# Patient Record
Sex: Male | Born: 1985 | Race: White | Hispanic: No | Marital: Single | Smoking: Never smoker
Health system: Southern US, Community
[De-identification: ages and names within clinical notes are randomized; demographics above are authoritative.]

## PROBLEM LIST (undated history)

## (undated) DIAGNOSIS — Z931 Gastrostomy status: Secondary | ICD-10-CM

## (undated) DIAGNOSIS — M419 Scoliosis, unspecified: Secondary | ICD-10-CM

## (undated) DIAGNOSIS — E1043 Type 1 diabetes mellitus with diabetic autonomic (poly)neuropathy: Secondary | ICD-10-CM

## (undated) DIAGNOSIS — E11649 Type 2 diabetes mellitus with hypoglycemia without coma: Secondary | ICD-10-CM

## (undated) DIAGNOSIS — G809 Cerebral palsy, unspecified: Secondary | ICD-10-CM

## (undated) DIAGNOSIS — G713 Mitochondrial myopathy, not elsewhere classified: Secondary | ICD-10-CM

## (undated) DIAGNOSIS — R5383 Other fatigue: Secondary | ICD-10-CM

## (undated) DIAGNOSIS — K219 Gastro-esophageal reflux disease without esophagitis: Secondary | ICD-10-CM

## (undated) DIAGNOSIS — F79 Unspecified intellectual disabilities: Secondary | ICD-10-CM

## (undated) DIAGNOSIS — M8580 Other specified disorders of bone density and structure, unspecified site: Secondary | ICD-10-CM

## (undated) HISTORY — PX: INGUINAL HERNIA REPAIR: SUR1180

## (undated) HISTORY — PX: NISSEN FUNDOPLICATION: SHX2091

## (undated) HISTORY — DX: Mitochondrial myopathy, not elsewhere classified: G71.3

## (undated) HISTORY — PX: GASTROSTOMY TUBE PLACEMENT: SHX655

## (undated) HISTORY — DX: Other fatigue: R53.83

## (undated) HISTORY — DX: Gastro-esophageal reflux disease without esophagitis: K21.9

## (undated) HISTORY — DX: Scoliosis, unspecified: M41.9

## (undated) HISTORY — DX: Type 1 diabetes mellitus with diabetic autonomic (poly)neuropathy: E10.43

## (undated) HISTORY — DX: Unspecified intellectual disabilities: F79

## (undated) HISTORY — PX: OTHER SURGICAL HISTORY: SHX169

## (undated) HISTORY — DX: Gastrostomy status: Z93.1

## (undated) HISTORY — DX: Type 2 diabetes mellitus with hypoglycemia without coma: E11.649

## (undated) HISTORY — DX: Other specified disorders of bone density and structure, unspecified site: M85.80

---
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
## 2006-07-07 ENCOUNTER — Ambulatory Visit: Payer: Self-pay | Admitting: "Endocrinology

## 2006-12-24 ENCOUNTER — Ambulatory Visit: Payer: Self-pay | Admitting: Internal Medicine

## 2007-03-23 ENCOUNTER — Ambulatory Visit: Payer: Self-pay | Admitting: "Endocrinology

## 2008-04-11 ENCOUNTER — Ambulatory Visit: Payer: Self-pay | Admitting: "Endocrinology

## 2009-01-06 ENCOUNTER — Telehealth (INDEPENDENT_AMBULATORY_CARE_PROVIDER_SITE_OTHER): Payer: Self-pay | Admitting: *Deleted

## 2009-02-21 ENCOUNTER — Ambulatory Visit: Payer: Self-pay | Admitting: "Endocrinology

## 2009-07-17 ENCOUNTER — Ambulatory Visit: Payer: Self-pay | Admitting: "Endocrinology

## 2009-12-25 ENCOUNTER — Ambulatory Visit: Payer: Self-pay | Admitting: "Endocrinology

## 2010-04-15 ENCOUNTER — Encounter: Payer: Self-pay | Admitting: Pediatrics

## 2010-04-30 ENCOUNTER — Ambulatory Visit: Payer: Self-pay | Admitting: "Endocrinology

## 2010-08-07 NOTE — Assessment & Plan Note (Signed)
[facility]                             PULMONARY OFFICE NOTE   NAME:RIVADENEYRADeveion, Thomas Conley                 MRN:          [id number]  DATE:12/24/2006                            DOB:          Mar 16, 1986    CHIEF COMPLAINT:  Cough.   HISTORY:  A 25 year old white male who carries a diagnosis of  myocardiomyopathy and scoliosis, but they're not really sure what's  wrong with him.  He is severely debilitated, wheelchair bound, G-tube  dependent for feeding, and has had recurrent episodes of pneumonia  complicated by lung abscess and bronchiectasis.  On his most recent  exacerbation of purulent tracheobronchitis, he was treated with a 5-day  course of Levaquin per G-tube, and is back to baseline now.   He apparently does better lying down in terms of both saturations and  mobilizing secretions.  The family has found that swimming is the best  exercise for him.  There has been no apparent significant increase in  dyspnea, decrease in sats, or increase in cough over baseline.  His  family appears extremely attentive regarding these issues.   The reason for his visit now is that he is approaching the age of 46,  and the family wanted him to have an adult pulmonologist.  He has been  under the care of Dr. Avel Sensor in pediatric pulmonology at [facility] since  he was a child.   PAST MEDICAL HISTORY:  Diabetes.   ALLERGIES:  SULFA, CEPHALOSPORINS, and AUGMENTIN caused GI problems.   MEDICATIONS:  1. Depakote.  2. Lantus.  3. NovoLog.  4. Albuterol.  5. Pulmicort.  6. Carafate.   SOCIAL HISTORY:  He, obviously, never smoked cigarettes.  He lives at  home and attends [employer].   FAMILY HISTORY:  Positive for asthma in grandparents only.   REVIEW OF SYSTEMS:  Taken in detail, negative except as outlined above.   PHYSICAL EXAMINATION:  This is a wheelchair-bound, chronically ill,  white male with obvious scoliotic deformity who does not make eye  contact  or follow any verbal commands including cough to request.  HEENT:  Unremarkable.  Oropharynx clear.  Dentition is intact.  NECK:  Supple without cervical adenopathy or tenderness.  CHEST:  Reveals severe kyphoscoliotic chest wall deformity.  Lung fields are diminished in the bases with few rhonchi, no definite  wheezing.  HEART:  Regular rate and rhythm without murmurs, gallops, or rubs.  ABDOMEN:  Soft, benign.  EXTREMITIES:  Warm without calf tenderness, cyanosis, clubbing, or  edema.  There was marked muscle wasting.   Hemoglobin saturation 92% on room air.   No recent chest x-rays available.   IMPRESSION:  This patient has a combination of restrictive lung disease  and an apparent myopathy as well as a central nervous system dysfunction  leading to very poor cough mechanics and a high risk of recurrent  pulmonary infections, as well as atelectasis and chronic respiratory  failure.  It is not clear to me that they have been fully informed as to  the likely outcome here (either accepting chronic ventilator or  premature death); however, I think they have  done a wonderful job taking  care of Thomas Conley, and do not have anything to add to his excellent care at  present.  I did applaud their efforts to get him more mobile, and  apparently swimming has been something that he enjoys doing, and has  been an effective mechanism to maintain mobility.   In terms of specifics, I recommended that the nebulizer treatments be  continued, and if increased wheezing occurs, simply increase the  Pulmicort to more frequent dosing (presently once a day is all they are  using, but they certainly could go up to 4 times a day if needed), and  to continue to use albuterol as well as the vest.  Although I am  available here in [place] to help out if needed, I strongly  recommended continued followup by the pediatric pulmonary service at  [facility], given the complexity of his care, and also would like Dr.   Avel Sensor to develop a contingency plan for this patient and his family in  terms of chronic ventilator, whether or not they would accept chronic  ventilator care, etc., rather than have this conversation with them  since they are brand new to me.  I did not bring this up.     Charlaine Dalton. Sherene Sires, MD, [facility]  Electronically Signed    MBW/MedQ  DD: 12/24/2006  DT: 12/25/2006  Job #: [id number]   cc:   Ardyth Harps, MD

## 2010-08-31 ENCOUNTER — Encounter: Payer: Self-pay | Admitting: Pediatrics

## 2010-08-31 DIAGNOSIS — R6252 Short stature (child): Secondary | ICD-10-CM

## 2010-08-31 DIAGNOSIS — E1065 Type 1 diabetes mellitus with hyperglycemia: Secondary | ICD-10-CM

## 2010-08-31 DIAGNOSIS — IMO0002 Reserved for concepts with insufficient information to code with codable children: Secondary | ICD-10-CM

## 2010-09-10 ENCOUNTER — Ambulatory Visit: Payer: Self-pay | Admitting: Pediatrics

## 2010-12-03 ENCOUNTER — Ambulatory Visit (INDEPENDENT_AMBULATORY_CARE_PROVIDER_SITE_OTHER): Payer: BC Managed Care – PPO | Admitting: "Endocrinology

## 2010-12-03 VITALS — BP 122/80 | HR 96

## 2010-12-03 DIAGNOSIS — E1169 Type 2 diabetes mellitus with other specified complication: Secondary | ICD-10-CM

## 2010-12-03 DIAGNOSIS — E049 Nontoxic goiter, unspecified: Secondary | ICD-10-CM

## 2010-12-03 DIAGNOSIS — E109 Type 1 diabetes mellitus without complications: Secondary | ICD-10-CM

## 2010-12-03 DIAGNOSIS — E1065 Type 1 diabetes mellitus with hyperglycemia: Secondary | ICD-10-CM

## 2010-12-03 DIAGNOSIS — IMO0002 Reserved for concepts with insufficient information to code with codable children: Secondary | ICD-10-CM

## 2010-12-03 DIAGNOSIS — E11649 Type 2 diabetes mellitus with hypoglycemia without coma: Secondary | ICD-10-CM

## 2010-12-03 LAB — POCT GLYCOSYLATED HEMOGLOBIN (HGB A1C): Hemoglobin A1C: 188

## 2010-12-03 LAB — GLUCOSE, POCT (MANUAL RESULT ENTRY): POC Glucose: 6.3

## 2010-12-03 NOTE — Patient Instructions (Signed)
Followup visit in 6 months. Please reduce the Lantus dose to 8 units at this time. Please have the thyroid labs and metabolic panel done at next blood draw for other studies.

## 2010-12-08 ENCOUNTER — Encounter: Payer: Self-pay | Admitting: "Endocrinology

## 2010-12-08 DIAGNOSIS — Z931 Gastrostomy status: Secondary | ICD-10-CM | POA: Insufficient documentation

## 2010-12-08 DIAGNOSIS — R5383 Other fatigue: Secondary | ICD-10-CM | POA: Insufficient documentation

## 2010-12-08 DIAGNOSIS — E11649 Type 2 diabetes mellitus with hypoglycemia without coma: Secondary | ICD-10-CM | POA: Insufficient documentation

## 2010-12-08 DIAGNOSIS — F79 Unspecified intellectual disabilities: Secondary | ICD-10-CM | POA: Insufficient documentation

## 2010-12-08 DIAGNOSIS — K219 Gastro-esophageal reflux disease without esophagitis: Secondary | ICD-10-CM | POA: Insufficient documentation

## 2010-12-08 DIAGNOSIS — A419 Sepsis, unspecified organism: Secondary | ICD-10-CM | POA: Insufficient documentation

## 2010-12-08 DIAGNOSIS — E1043 Type 1 diabetes mellitus with diabetic autonomic (poly)neuropathy: Secondary | ICD-10-CM | POA: Insufficient documentation

## 2010-12-08 DIAGNOSIS — G713 Mitochondrial myopathy, not elsewhere classified: Secondary | ICD-10-CM | POA: Insufficient documentation

## 2010-12-08 DIAGNOSIS — M8580 Other specified disorders of bone density and structure, unspecified site: Secondary | ICD-10-CM | POA: Insufficient documentation

## 2010-12-08 NOTE — Progress Notes (Addendum)
Subjective:  Patient Name: Thomas Conley Date of Birth: Dec 18, 1985  MRN: [id number]  Thomas Conley  presents to the office today for follow-up of his type 1 diabetes mellitus, hypoglycemia, goiter, autonomic neuropathy, tachycardia, mitochondrial myopathy, mental retardation, GERD, bronchiectasis, osteopenia, fatigue, gastrostomy tube dependence, and wheelchair dependence.  HISTORY OF PRESENT ILLNESS:   Thomas Conley is a 25 y.o. Caucasian young man. Thomas Conley was accompanied by his father.  1. The patient was first referred to me on 04/20/04 by his primary care pediatrician, Dr. Loyola Mast M.D., for evaluation and management of diabetes and other potentially  undiagnosed endocrine disease. Thomas Conley had just had his 19th birthday. He had a very interesting and challenging past medical history. I will address his past medical history in the following sub-paragraphs.  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 5 oz. His Apgar scores were high. There were no visible congenital anomalies. However, by 20 months of age he was failing to thrive in terms of height growth and weight growth and he was not meeting his developmental milestones. 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 The infant had GERD with projectile vomiting. At sometime in the past the child was able to walk with minimal assistance, but he later regressed neurologically and physically. By the time he came to see me he was completely wheelchair-dependent. It appears to me that in the last 6 years he has had a slow but progressive continued deterioration.  B. GERD, swallowing dysfunction, failure to thrive, and growth delay: At about 66 months of age he was noted to have the GERD with projectile vomiting and swallowing dysfunction. At about 46 months of age he had an episode of pneumonia which was thought possibly related to aspiration. Gastrostomy tube placement was  recommended at that time, but the parents refused. In 1988, however, it was clear that the reflux and swallowing dysfunction were continuing to be  problems and were interfering with his growth. A Nissen fundoplication and gastrostomy tube placement were done at that time. He was subsequently able to gain weight. He has remained on G-tube feedings ever since. He typically is fed every 2-1/2-3 hours.  C. Type 1 diabetes mellitus: About 2 weeks after the G-tube site was revised in approximately 1999, the child had an episode of peritonitis. At that time his blood glucose levels were noted to be elevated. The hemoglobin A1c was already elevated at that time. In 1999 or early 2000 he had an episode of hospitalization for diabetic ketoacidosis. He was initially treated with Novolin R insulin by sliding scale, with doses being given every one to 3 hours. About 3 years ago Lantus insulin was added. When I saw him for the first time in 2006, although his hemoglobin A1c was good at 6.9%, he was having a lot of very high and very low sugars. In looking at his blood glucose profiles and comparing them to his feeding profiles, it appeared that there was a major mismatch between the pharmacokinetics of his R. insulin and his feeding times. The time of peak and time of duration of the R insulin did not match the timing of the feedings that he was receiving. I changed his Novolin R to NovoLog aspart insulin, which matched the timing of his G-tube feedings much more closely. At that point his blood sugar values significantly improved. During the last 6 years his hemoglobin A1c values have ranged between 6.1-7.3%. Although in January  2009 his insulin C-peptide was within normal limits at 1.59 (normal 0.80-3.9), in January 2010 C-peptide had dropped to 0.82.  It appears that after his initial presentation with DKA he had a protracted honeymoon period. Over time, however, his ability to produce insulin has decreased. As a result his  Lantus doses have increased. His most recent Lantus dose is 9 units at bedtime. His dad manages his NovoLog aspart insulin doses very closely, giving him injections every 2-1/2 to 3 hours. At times the blood sugars have dropped too low, and I've asked dad to reduce insulin doses accordingly.  D. Neuropathy and Tachycardia: At times when I have examined this young man, his feet have been relatively insensitive to touch. He's also had a resting tachycardia on clinic visits. While he does use an albuterol nebulizer 2-4 times a day when he needs to, I have suspected for a long time that he has both mild peripheral neuropathy and perhaps a more moderate autonomic neuropathy with tachycardia. Trying to achieve reasonable blood sugar control is the appropriate treatment for both forms of neuropathy. 2. The patient's last [facility] visit was on 12/25/09. In the interim, the patient had an appendectomy in February. He was given narcotics for pain and stopped breathing. Resuscitation was required. He had to be on CPAP(?) for a period of hours. He was recently on a Z-pack for recurrent otitis media. He remains on his usual doses of Lantus insulin. He receives feedings by G-tube every 2.5-3  hours. Father adjusts his NovoLog doses 6-8 times a day based on the blood glucose results and how well the tube feedings are going. 3. Pertinent Review of Systems:  Constitutional: The patient remains wheelchair-dependent and G-tube-dependent. The curvature of the spine is getting worse and affects his breathing. Although he is not able to speak, he is able to sign a few simple words. He is able to interact with his parents and others in a limited way, with neither improvement nor deterioration since last visit. Dad thinks that he is able to move his arms a little bit better recently. He thinks Thomas Conley is also more interested in looking at doctors and other people that he visits with. Eyes: Vision is good with his current glasses. He had a  dilated diabetic eye examination 2 months ago.There were no reported signs of diabetic eye disease.  Neck: There are no apparent problems associated with of anterior neck swelling, soreness, tenderness,  pressure, discomfort, or difficulty swallowing.  Heart: Heart rate  remains consistently elevated. There are no apparent problems associated with palpitations, irregular heat beats, chest pain, or chest pressure. Gastrointestinal: There are no apparent problems associated with excessive hunger, acid reflux, upset stomach, stomach aches or pains, diarrhea, or constipation. He continues to receive a fiber solution through his G-tube. Legs: Muscle mass and strength  are much below normal. There are no apparent problems associated with numbness, tingling, burning, or pain. No edema is noted recently. Feet: There are no obvious foot problems. There are no apparent problems associated with of numbness, tingling, burning, or pain. No edema is noted Neurologic: There are no significant changes, except as noted above. 4. BG printout: Patient is having quite a few blood sugars in the 45-65 range. He had 3 of these in the past 2 weeks. His morning blood sugars are mostly in the 70s and 80s.   PAST MEDICAL, FAMILY, AND SOCIAL HISTORY:  Past Medical History  Diagnosis Date  . 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  Mitochondrial myopathy   . Mental retardation   . GERD (gastroesophageal reflux disease)   . Bronchiectasis   . Hypoglycemia associated with diabetes   . Osteopenia   . Fatigue   . Gastrostomy tube dependent     No family history on file.  Current outpatient prescriptions:divalproex (DEPAKOTE) 125 MG EC tablet, Take 125 mg by mouth 3 (three) times daily.  , Disp: , Rfl: ;  Ibuprofen (MOTRIN PO), Take by mouth.  , Disp: , Rfl: ;  Insulin Aspart (NOVOLOG FLEXPEN [place]), Inject into the skin.  , Disp: , Rfl: ;  insulin glargine (LANTUS) 100 UNIT/ML injection, Inject 9 Units  into the skin at bedtime. , Disp: , Rfl:  nystatin (MYCOSTATIN) powder, Apply topically 4 (four) times daily.  , Disp: , Rfl: ;  Tobramycin Sulfate (TOBREX OP), Apply to eye.  , Disp: , Rfl:   Allergies as of 12/03/2010 - Review Complete 12/03/2010  Allergen Reaction Noted  . Augmentin  08/31/2010  . Ceftin  08/31/2010  . Cephalosporins  08/31/2010  . Klonopin (clonazepam)  08/31/2010  . Morphine and related  12/03/2010  . Sulfa antibiotics  08/31/2010  . Tegretol (carbamazepine)  08/31/2010   1. Work and Family: Dad remains his major caretaker during the day. 2. Activities: The patient's ability to perform physical activities is very limited. 3. Smoking, alcohol, or drugs: None 4. Primary Care Provider: Dr. Loyola Mast   ROS: There are no other significant problems involving the patient's other body systems.   Objective:  Vital Signs:  BP 122/80  Pulse 96   Ht Readings from Last 3 Encounters:  No data found for Ht   PHYSICAL EXAM: Constitutional: The patient is sitting in his wheelchair, slumped toward the right side. He has a smiling, almost angelic expression on his face. He follows with his eyes, but has only limited ability to turn his head. His body is small and his muscles are quite atrophic.  Head: The head is normocephalic. Face: The face appears normal. There are no obvious dysmorphic features. Eyes: The eyes appear to be normally formed and spaced. Gaze is conjugate. There is no obvious arcus or proptosis. Moisture appears normal. Ears: The ears are normally placed and appear externally normal. Mouth: It is difficult to examine his mouth.  Neck: The neck appears to be visibly normal. No carotid bruits are noted. The thyroid gland is about 18-20 grams in size. The consistency of the thyroid gland is normal. The thyroid gland is not tender to palpation. He is tickled by this type of examination. Lungs: The lungs are clear to auscultation. Air movement is good. Heart:  Heart rate and rhythm are regular. Heart sounds S1 and S2 are normal. I did not appreciate any pathologic cardiac murmurs. Abdomen: The abdomen appears to be normal in size for the patient's size. Bowel sounds are normal. There is no obvious hepatomegaly, splenomegaly, or other mass effect.  Arms: Muscle size and bulk are much below normal for age. He has a little motion of his forearms and hands. Hands: There is no obvious tremor. Phalangeal and metacarpophalangeal joints are normal. Palmar muscles are atrophic for age. Palmar skin is normal. Palmar moisture is also normal. Legs: Muscles appear atrophic for age. No edema is present. Neurologic: Strength is far below normal for age in both the upper and lower extremities. Muscle tone is low. Sensation to touch is probably normal in both the legs and feet.    LAB DATA: Hemoglobin A1c was 6.3%  today.    Assessment and Plan:   ASSESSMENT:  1. Type 1 diabetes mellitus: Although his blood sugars as a whole have increased somewhat, he is also having to many low blood sugars. 2. Hypoglycemia: Although the number of low blood sugars is somewhat less frequent on this visit, he is still having many lows in the 40s to 60s. This increases the chance of having an epileptic seizure associated with hypoglycemia. 3. Goiter: Thyroid gland is normal size today. His TSH in October 2011 was at the lower limit of normal. 4. Mitochondrial myopathy: It is possible this problem maybe somewhat better in the past several months. 5. Autonomic neuropathy and tachycardia: These problems are essentially unchanged.   PLAN:  1. Diagnostic: Thyroid function tests and CMP at his next laboratory draw. 2. Therapeutic: Reduce the dose of Lantus insulin to 8 units at bedtime. 3. Patient education: We discussed the issue of hypoglycemia unawareness. In Thomas Conley, who has neither the cognitive abilities to recognize his low blood sugar nor the neurologic abilities to respond to  hypoglycemia, it is especially important to keep the blood sugars above 80. 4. Follow-up: Return in about 6 months (around 06/02/2011).  Level of Service: This visit lasted in excess of 40 minutes. More than 50% of the visit was devoted to counseling.  David Stall

## 2010-12-09 ENCOUNTER — Encounter: Payer: Self-pay | Admitting: "Endocrinology

## 2010-12-09 DIAGNOSIS — M419 Scoliosis, unspecified: Secondary | ICD-10-CM | POA: Insufficient documentation

## 2011-02-04 ENCOUNTER — Ambulatory Visit: Payer: BC Managed Care – PPO | Attending: Pediatrics | Admitting: Physical Therapy

## 2011-02-04 DIAGNOSIS — IMO0001 Reserved for inherently not codable concepts without codable children: Secondary | ICD-10-CM | POA: Insufficient documentation

## 2011-02-04 DIAGNOSIS — M24559 Contracture, unspecified hip: Secondary | ICD-10-CM | POA: Insufficient documentation

## 2011-02-26 ENCOUNTER — Other Ambulatory Visit: Payer: Self-pay | Admitting: "Endocrinology

## 2011-03-01 ENCOUNTER — Ambulatory Visit: Payer: BC Managed Care – PPO | Attending: Pediatrics | Admitting: Physical Therapy

## 2011-03-01 DIAGNOSIS — M24559 Contracture, unspecified hip: Secondary | ICD-10-CM | POA: Insufficient documentation

## 2011-03-01 DIAGNOSIS — IMO0001 Reserved for inherently not codable concepts without codable children: Secondary | ICD-10-CM | POA: Insufficient documentation

## 2011-04-05 ENCOUNTER — Ambulatory Visit: Payer: BC Managed Care – PPO | Attending: Pediatrics | Admitting: Physical Therapy

## 2011-04-05 DIAGNOSIS — IMO0001 Reserved for inherently not codable concepts without codable children: Secondary | ICD-10-CM | POA: Insufficient documentation

## 2011-04-05 DIAGNOSIS — M24559 Contracture, unspecified hip: Secondary | ICD-10-CM | POA: Insufficient documentation

## 2011-06-03 ENCOUNTER — Encounter: Payer: Self-pay | Admitting: "Endocrinology

## 2011-06-03 ENCOUNTER — Ambulatory Visit (INDEPENDENT_AMBULATORY_CARE_PROVIDER_SITE_OTHER): Payer: Medicaid Other | Admitting: "Endocrinology

## 2011-06-03 VITALS — BP 107/67 | HR 91

## 2011-06-03 DIAGNOSIS — G7289 Other specified myopathies: Secondary | ICD-10-CM

## 2011-06-03 DIAGNOSIS — E1143 Type 2 diabetes mellitus with diabetic autonomic (poly)neuropathy: Secondary | ICD-10-CM

## 2011-06-03 DIAGNOSIS — E1065 Type 1 diabetes mellitus with hyperglycemia: Secondary | ICD-10-CM

## 2011-06-03 DIAGNOSIS — R000 Tachycardia, unspecified: Secondary | ICD-10-CM

## 2011-06-03 DIAGNOSIS — E1169 Type 2 diabetes mellitus with other specified complication: Secondary | ICD-10-CM

## 2011-06-03 DIAGNOSIS — IMO0002 Reserved for concepts with insufficient information to code with codable children: Secondary | ICD-10-CM

## 2011-06-03 DIAGNOSIS — E1142 Type 2 diabetes mellitus with diabetic polyneuropathy: Secondary | ICD-10-CM

## 2011-06-03 DIAGNOSIS — G909 Disorder of the autonomic nervous system, unspecified: Secondary | ICD-10-CM

## 2011-06-03 DIAGNOSIS — E1149 Type 2 diabetes mellitus with other diabetic neurological complication: Secondary | ICD-10-CM

## 2011-06-03 DIAGNOSIS — E11649 Type 2 diabetes mellitus with hypoglycemia without coma: Secondary | ICD-10-CM

## 2011-06-03 DIAGNOSIS — G713 Mitochondrial myopathy, not elsewhere classified: Secondary | ICD-10-CM

## 2011-06-03 DIAGNOSIS — E049 Nontoxic goiter, unspecified: Secondary | ICD-10-CM

## 2011-06-03 LAB — POCT GLYCOSYLATED HEMOGLOBIN (HGB A1C): Hemoglobin A1C: 5.3

## 2011-06-03 LAB — GLUCOSE, POCT (MANUAL RESULT ENTRY): POC Glucose: 221

## 2011-06-03 NOTE — Progress Notes (Addendum)
Subjective:  Patient Name: Thomas Conley Date of Birth: April 07, 1985  MRN: [id number]  Thomas Conley  presents to the office today for follow-up of his type 1 diabetes mellitus, hypoglycemia, goiter, autonomic neuropathy, tachycardia, mitochondrial myopathy, mental retardation, GERD, bronchiectasis,  osteopenia, fatigue, gastrostomy tube dependence, and wheelchair dependence.  HISTORY OF PRESENT ILLNESS:   Thomas Conley is a 26 y.o. Caucasian young man. Misty was accompanied by his father.  1. The patient was first referred to me on 04/20/04 by his primary care pediatrician, Dr. Loyola Mast M.D., for evaluation and management of diabetes and other potentially  undiagnosed endocrine disease. Thomas Conley had just had his 19th birthday. He had a very interesting and challenging past medical history. I will address his past medical history in the following sub-paragraphs.  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 5 oz. His Apgar scores were high. There were no visible congenital anomalies. However, by 31 months of age he was failing to thrive in terms of height growth and weight growth and he was not meeting his developmental milestones. 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 The infant had GERD with projectile vomiting. At sometime in the past the child was able to walk with minimal assistance, but he later regressed neurologically and physically. By the time he came to see me he was completely wheelchair-dependent. It appears to me that in the last 6 years he has had a slow but progressive continued deterioration.  B. GERD, swallowing dysfunction, failure to thrive, and growth delay: At about 10 months of age he was noted to have the GERD with projectile vomiting and swallowing dysfunction. At about 82 months of age he had an episode of pneumonia which was thought possibly related to aspiration. Gastrostomy tube placement was  recommended at that time, but the parents refused. In 1988, however, it was clear that the reflux and swallowing dysfunction were continuing to be  problems and were interfering with his growth. A Nissen fundoplication and gastrostomy tube placement were done at that time. He was subsequently able to gain weight. He has remained on G-tube feedings ever since. He typically is fed every 2-1/2-3 hours.  C. Type 1 diabetes mellitus: About 2 weeks after the G-tube site was revised in approximately 1999, the child had an episode of peritonitis. At that time his blood glucose levels were noted to be elevated. The hemoglobin A1c was already elevated at that time. In 1999 or early 2000 he had an episode of hospitalization for diabetic ketoacidosis. He was initially treated with Novolin R insulin by sliding scale, with doses being given every one to 3 hours. About 3 years ago Lantus insulin was added. When I saw him for the first time in 2006, although his hemoglobin A1c was good at 6.9%, he was having a lot of very high and very low sugars. In looking at his blood glucose profiles and comparing them to his feeding profiles, it appeared that there was a major mismatch between the pharmacokinetics of his R. insulin and his feeding times. The time of peak and time of duration of the R insulin did not match the timing of the feedings that he was receiving. I changed his Novolin R to NovoLog aspart insulin, which matched the timing of his G-tube feedings much more closely. At that point his blood sugar values significantly improved. During the last 6 years his hemoglobin A1c values have ranged between 6.1-7.3%. Although in  January 2009 his insulin C-peptide was within normal limits at 1.59 (normal 0.80-3.9), in January 2010 C-peptide had dropped to 0.82.  It appears that after his initial presentation with DKA he had a protracted honeymoon period. Over time, however, his ability to produce insulin has decreased. As a result his  Lantus doses have increased. His most recent Lantus dose is 9 units at bedtime. His dad manages his NovoLog aspart insulin doses very closely, giving him injections every 2-1/2 to 3 hours. At times the blood sugars have dropped too low, and I've asked dad to reduce insulin doses accordingly.  D. Peripheral neuropathy, autonomic neuropathy, and tachycardia: At times when I examined this young man, his feet have been relatively insensitive to touch. He's also had a resting tachycardia on clinic visits. While he does use an albuterol nebulizer 2-4 times a day when he needs to, I have suspected for a long time that he has both mild peripheral neuropathy and perhaps a more moderate autonomic neuropathy with tachycardia. Trying to achieve reasonable blood sugar control is the appropriate treatment for both forms of neuropathy. 2. The patient's last [facility] visit was on 12/03/10. In the interim, the patient has been pretty healthy, except for a URI about a month ago and a couple of episodes of otitis media. He is still sensitive to amoxicillin, but is resistant to many other antibiotics.  He remains on his usual doses of Lantus insulin, which is now 9 units. Dad had forgotten that I asked him to reduce the Lantus dose to 8 units at last visit. When Thomas Conley gets sick and has increased BGs, dad increases his Lantus dose by 1-2 units. Thomas Conley receives feedings by G-tube every 2.5-3  hours. Father adjusts his NovoLog doses 6-8 times a day based on the blood glucose results and how well the tube feedings are going. 3. Pertinent Review of Systems:  Constitutional: The patient remains wheelchair-dependent and G-tube-dependent. The curvature of the spine is about status quo. Family is not interested in trying surgery. The parents are afraid that he would develop an overwhelming infection and die of sepsis. Although Thomas Conley is not able to speak, he is able to sign a few simple words. He is able to interact with his parents and others in  a limited way, with neither improvement nor deterioration since last visit. Some days go better than others. Thomas Conley's allergies are beginning to act up. Eyes: Vision is good with his current glasses. He had a dilated diabetic eye examination 8 months ago.There were no reported signs of diabetic eye disease.  Neck: There are no apparent problems associated with of anterior neck swelling, soreness, tenderness,  pressure, discomfort, or difficulty swallowing.  Heart: Heart rate  remains consistently elevated. There are no apparent problems associated with palpitations, irregular heat beats, chest pain, or chest pressure. Gastrointestinal: There are no apparent problems associated with excessive hunger, acid reflux, upset stomach, stomach aches or pains, diarrhea, or constipation. He continues to receive a fiber solution through his G-tube. Legs: Muscle mass and strength  are much below normal. There are no apparent problems associated with numbness, tingling, burning, or pain. No edema is noted recently. Feet: There are no obvious foot problems. There are no apparent problems associated with of numbness, tingling, burning, or pain. No edema is noted Neurologic: There are no significant changes, except as noted above. Hypoglycemia: Most episodes occur at lunch on weekends.  4. BG printout: Patient is having occasional BGs in the 50s-60s, usually at lunch on  weekends, especially if he eats lunch late. Dad says that Thomas Conley is more physically active on weekends. Thomas Conley's average BG is about 107.   PAST MEDICAL, FAMILY, AND SOCIAL HISTORY:  Past Medical History  Diagnosis Date  . 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 Mitochondrial myopathy   . Mental retardation   . GERD (gastroesophageal reflux disease)   . Bronchiectasis   . Hypoglycemia associated with diabetes   . Osteopenia   . Fatigue   . Gastrostomy tube dependent   . Scoliosis     Family History  Problem Relation  Age of Onset  . Cancer Neg Hx   . Diabetes Neg Hx   . Thyroid disease Neg Hx     Current outpatient prescriptions:divalproex (DEPAKOTE) 125 MG EC tablet, Take 125 mg by mouth 3 (three) times daily.  , Disp: , Rfl: ;  Ibuprofen (MOTRIN PO), Take by mouth.  , Disp: , Rfl: ;  insulin glargine (LANTUS) 100 UNIT/ML injection, Inject 9 Units into the skin at bedtime. , Disp: , Rfl: ;  NOVOLOG FLEXPEN 100 UNIT/ML injection, USE AS DIRECTED WITH FEEDING, Disp: 15 Syringe, Rfl: 5 nystatin (MYCOSTATIN) powder, Apply topically 4 (four) times daily.  , Disp: , Rfl: ;  Tobramycin Sulfate (TOBREX OP), Apply to eye.  , Disp: , Rfl:   Allergies as of 06/03/2011 - Review Complete 06/03/2011  Allergen Reaction Noted  . Augmentin  08/31/2010  . Cefuroxime axetil  08/31/2010  . Cephalosporins  08/31/2010  . Klonopin (clonazepam)  08/31/2010  . Morphine and related  12/03/2010  . Sulfa antibiotics  08/31/2010  . Tegretol (carbamazepine)  08/31/2010   1. Work and Family: Dad remains his major caretaker during the day. 2. Activities: The patient's ability to perform physical activities is very limited. 3. Smoking, alcohol, or drugs: None 4. Primary Care Provider: Dr. Loyola Mast   ROS: There are no other significant problems involving the patient's other body systems.   Objective:  Vital Signs:  BP 107/67  Pulse 91   Ht Readings from Last 3 Encounters:  No data found for Ht   PHYSICAL EXAM: Constitutional: The patient is sitting in his wheelchair, slumped toward the left side. He has a smiling, almost angelic expression on his face. He was not as engaged earlier in the visit, but once I began the physical exam he was more engaged. He even laughed a lot. He follows with his eyes, but has only limited ability to turn his head. His body is small and his muscles are quite atrophic.  Head: The head is normocephalic. Face: The face appears normal. There are no obvious dysmorphic features. Eyes: The eyes  appear to be normally formed and spaced. Gaze is conjugate. There is no obvious arcus or proptosis. Moisture appears normal. Ears: The ears are normally placed and appear externally normal. Mouth: It is difficult to examine his mouth. He had a little gingivitis around one of his right maxillary pre-molar teeth. Neck: The neck appears to be visibly normal. No carotid bruits are noted. The thyroid exam is difficult today, since his neck is bent laterally to the left. The gland is about 18-20 grams in size. The consistency of the thyroid gland is normal. The thyroid gland is not tender to palpation. He is tickled by this type of examination. Lungs: The lungs are clear to auscultation. Air movement is good. Heart: Heart rate and rhythm are regular. Heart sounds S1 and S2 are normal. I did not  appreciate any pathologic cardiac murmurs. Abdomen: The abdomen appears to be normal in size for the patient's size. Bowel sounds are normal. There is no obvious hepatomegaly, splenomegaly, or other mass effect.  Arms: Muscle size and bulk are much below normal for age. He has a little motion of his forearms and hands. Hands: There is no obvious tremor. Phalangeal and metacarpophalangeal joints are normal. Palmar muscles are atrophic for age. Palmar skin is normal. Palmar moisture is also normal. Legs: Muscles appear atrophic for age. No edema is present. Neurologic: Strength is far below normal for age in both the upper and lower extremities. Muscle tone is low. Sensation to touch is probably normal in both the legs and feet.    LAB DATA: Hemoglobin A1c was 5.3% today, versus 6.3% in September.    Assessment and Plan:   ASSESSMENT:  1. Type 1 diabetes mellitus: His blood sugars as a whole have decreased somewhat. He is still having to many low blood sugars.  2. Hypoglycemia: He is having too many low BGs. Given his inability to recognize and respond to hypoglycemia, we need to be less tight with his insulin  regimen.  We want to minimize the risks of him having so low a BG that he has an epileptic seizure. 3. Goiter: Thyroid gland is normal size today. His TSH in October 2011 was at the lower limit of normal. Dad forgot to have his CMP and thyroid labs done when Dr. Rana Snare ordered a CBC recently. 4. Mitochondrial myopathy: This problem is probably essentially unchanged.  5. Autonomic neuropathy and tachycardia: The heart rate is lower today, paralleling the lower BGs.    PLAN:  1. Diagnostic: Thyroid function tests and CMP at his next laboratory draw. 2. Therapeutic: Reduce the dose of Lantus insulin to 8 units at bedtime. 3. Patient education: We discussed the issue of hypoglycemia unawareness. In Thomas Conley, who has neither the cognitive abilities to recognize his low blood sugar nor the neurologic abilities to respond to hypoglycemia, it is especially important to keep the blood sugars above 80. 4. Follow-up: 6 months. Dad will call if he has increased difficulties in controlling Thomas Conley's BGs   Level of Service: This visit lasted in excess of 40 minutes. More than 50% of the visit was devoted to counseling.  David Stall

## 2011-06-03 NOTE — Patient Instructions (Signed)
Followup visit in 6 months. Please reduce the Lantus dose to 8 units at bedtime. Please reduce the breakfast NovoLog by 1-2 units if planning to be out and about on weekends or if lunch is anticipated to be later than usual.

## 2011-11-18 ENCOUNTER — Other Ambulatory Visit: Payer: Self-pay | Admitting: Pediatrics

## 2011-11-18 ENCOUNTER — Ambulatory Visit
Admission: RE | Admit: 2011-11-18 | Discharge: 2011-11-18 | Disposition: A | Discharge status: Home / Self Care | Payer: BC Managed Care – PPO | Source: Ambulatory Visit | Attending: Pediatrics | Admitting: Pediatrics

## 2011-11-18 DIAGNOSIS — R05 Cough: Secondary | ICD-10-CM

## 2011-11-29 ENCOUNTER — Other Ambulatory Visit (HOSPITAL_COMMUNITY): Payer: Self-pay | Admitting: Pediatrics

## 2011-11-29 DIAGNOSIS — Z931 Gastrostomy status: Secondary | ICD-10-CM

## 2011-12-05 ENCOUNTER — Other Ambulatory Visit (HOSPITAL_COMMUNITY): Payer: BC Managed Care – PPO

## 2011-12-20 ENCOUNTER — Other Ambulatory Visit: Payer: Self-pay | Admitting: Pediatrics

## 2011-12-20 ENCOUNTER — Ambulatory Visit
Admission: RE | Admit: 2011-12-20 | Discharge: 2011-12-20 | Disposition: A | Discharge status: Home / Self Care | Payer: BC Managed Care – PPO | Source: Ambulatory Visit | Attending: Pediatrics | Admitting: Pediatrics

## 2011-12-20 ENCOUNTER — Other Ambulatory Visit: Payer: Self-pay | Admitting: "Endocrinology

## 2011-12-20 DIAGNOSIS — R05 Cough: Secondary | ICD-10-CM

## 2011-12-20 DIAGNOSIS — R062 Wheezing: Secondary | ICD-10-CM

## 2011-12-23 ENCOUNTER — Ambulatory Visit: Payer: BC Managed Care – PPO | Attending: Pediatrics | Admitting: Physical Therapy

## 2011-12-23 DIAGNOSIS — R293 Abnormal posture: Secondary | ICD-10-CM | POA: Insufficient documentation

## 2011-12-23 DIAGNOSIS — M24559 Contracture, unspecified hip: Secondary | ICD-10-CM | POA: Insufficient documentation

## 2011-12-23 DIAGNOSIS — IMO0001 Reserved for inherently not codable concepts without codable children: Secondary | ICD-10-CM | POA: Insufficient documentation

## 2011-12-30 ENCOUNTER — Ambulatory Visit: Payer: BC Managed Care – PPO | Admitting: "Endocrinology

## 2012-07-03 ENCOUNTER — Other Ambulatory Visit: Payer: Self-pay | Admitting: "Endocrinology

## 2012-08-25 ENCOUNTER — Other Ambulatory Visit: Payer: Self-pay | Admitting: "Endocrinology

## 2013-02-01 ENCOUNTER — Emergency Department (HOSPITAL_COMMUNITY)
Admission: EM | Admit: 2013-02-01 | Discharge: 2013-02-01 | Disposition: A | Discharge status: Home / Self Care | Payer: Managed Care, Other (non HMO) | Attending: Emergency Medicine | Admitting: Emergency Medicine

## 2013-02-01 ENCOUNTER — Encounter (HOSPITAL_COMMUNITY): Payer: Self-pay | Admitting: Emergency Medicine

## 2013-02-01 ENCOUNTER — Emergency Department (HOSPITAL_COMMUNITY): Payer: Managed Care, Other (non HMO)

## 2013-02-01 DIAGNOSIS — Z794 Long term (current) use of insulin: Secondary | ICD-10-CM | POA: Insufficient documentation

## 2013-02-01 DIAGNOSIS — F79 Unspecified intellectual disabilities: Secondary | ICD-10-CM | POA: Insufficient documentation

## 2013-02-01 DIAGNOSIS — W050XXA Fall from non-moving wheelchair, initial encounter: Secondary | ICD-10-CM | POA: Insufficient documentation

## 2013-02-01 DIAGNOSIS — S82009A Unspecified fracture of unspecified patella, initial encounter for closed fracture: Secondary | ICD-10-CM | POA: Insufficient documentation

## 2013-02-01 DIAGNOSIS — Z882 Allergy status to sulfonamides status: Secondary | ICD-10-CM | POA: Insufficient documentation

## 2013-02-01 DIAGNOSIS — Y939 Activity, unspecified: Secondary | ICD-10-CM | POA: Insufficient documentation

## 2013-02-01 DIAGNOSIS — S0003XA Contusion of scalp, initial encounter: Secondary | ICD-10-CM | POA: Insufficient documentation

## 2013-02-01 DIAGNOSIS — E109 Type 1 diabetes mellitus without complications: Secondary | ICD-10-CM | POA: Insufficient documentation

## 2013-02-01 DIAGNOSIS — Z8709 Personal history of other diseases of the respiratory system: Secondary | ICD-10-CM | POA: Insufficient documentation

## 2013-02-01 DIAGNOSIS — S0001XA Abrasion of scalp, initial encounter: Secondary | ICD-10-CM

## 2013-02-01 DIAGNOSIS — S0990XA Unspecified injury of head, initial encounter: Secondary | ICD-10-CM

## 2013-02-01 DIAGNOSIS — M4120 Other idiopathic scoliosis, site unspecified: Secondary | ICD-10-CM | POA: Insufficient documentation

## 2013-02-01 DIAGNOSIS — Z79899 Other long term (current) drug therapy: Secondary | ICD-10-CM | POA: Insufficient documentation

## 2013-02-01 DIAGNOSIS — Z931 Gastrostomy status: Secondary | ICD-10-CM | POA: Insufficient documentation

## 2013-02-01 DIAGNOSIS — S81009A Unspecified open wound, unspecified knee, initial encounter: Secondary | ICD-10-CM | POA: Insufficient documentation

## 2013-02-01 DIAGNOSIS — R269 Unspecified abnormalities of gait and mobility: Secondary | ICD-10-CM | POA: Insufficient documentation

## 2013-02-01 DIAGNOSIS — Y929 Unspecified place or not applicable: Secondary | ICD-10-CM | POA: Insufficient documentation

## 2013-02-01 DIAGNOSIS — M899 Disorder of bone, unspecified: Secondary | ICD-10-CM | POA: Insufficient documentation

## 2013-02-01 DIAGNOSIS — G7289 Other specified myopathies: Secondary | ICD-10-CM | POA: Insufficient documentation

## 2013-02-01 DIAGNOSIS — S82002A Unspecified fracture of left patella, initial encounter for closed fracture: Secondary | ICD-10-CM

## 2013-02-01 DIAGNOSIS — Z993 Dependence on wheelchair: Secondary | ICD-10-CM | POA: Insufficient documentation

## 2013-02-01 DIAGNOSIS — Z881 Allergy status to other antibiotic agents status: Secondary | ICD-10-CM | POA: Insufficient documentation

## 2013-02-01 DIAGNOSIS — M6281 Muscle weakness (generalized): Secondary | ICD-10-CM | POA: Insufficient documentation

## 2013-02-01 DIAGNOSIS — K219 Gastro-esophageal reflux disease without esophagitis: Secondary | ICD-10-CM | POA: Insufficient documentation

## 2013-02-01 DIAGNOSIS — Z888 Allergy status to other drugs, medicaments and biological substances status: Secondary | ICD-10-CM | POA: Insufficient documentation

## 2013-02-01 DIAGNOSIS — IMO0002 Reserved for concepts with insufficient information to code with codable children: Secondary | ICD-10-CM | POA: Insufficient documentation

## 2013-02-01 DIAGNOSIS — Z885 Allergy status to narcotic agent status: Secondary | ICD-10-CM | POA: Insufficient documentation

## 2013-02-01 MED ORDER — IBUPROFEN 100 MG/5ML PO SUSP
400.0000 mg | Freq: Once | ORAL | Status: AC
  Administered 2013-02-01: 400 mg
  Filled 2013-02-01: qty 20

## 2013-02-01 NOTE — ED Notes (Signed)
Father feeding and giving daily meds thru PEG tube, child alert, NAD, calm, no dyspnea, tracking and interactive per norm with some mild grimacing per parents. VSS.

## 2013-02-01 NOTE — ED Notes (Addendum)
Per ems pt was in his wheelchair when he fell face forward on the concrete, small hematoma to right forehead. EMS reports no LOC, per dad pt is alert and oriented. EMS reports vital signs stable, CBG 57 en route. Pt dad gave pt something to eat en route. 120 hr bp 148/103 65% (pt dad states this is normal. )

## 2013-02-01 NOTE — ED Notes (Addendum)
Pt updated, EDP into room prior to d/c, parents x2 present.

## 2013-02-01 NOTE — ED Notes (Signed)
Back from xray/ CT, parents and family at [facility], NAD, calm, no changes.

## 2013-02-01 NOTE — ED Provider Notes (Signed)
CSN: [id number]     Arrival date & time 02/01/13  1750 History   First MD Initiated Contact with Patient 02/01/13 1759     Chief Complaint  Patient presents with  . Fall   (Consider location/radiation/quality/duration/timing/severity/associated sxs/prior Treatment) HPI Comments: 26 yo male with DM, mitochondrial myopathy, bronchiectasis, G tube feeding, wheel chair use presents with facial and knee injuries since PTA when he fell forward out of his wheel chair, he was not strapped in.  No loc or vomiting. Pt at baseline.  Shows pain with palpation of injuries per father.   Patient is a 27 y.o. male presenting with fall. The history is provided by a parent.  Fall This is a new problem.    Past Medical History  Diagnosis Date  . 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 Mitochondrial myopathy   . Mental retardation   . GERD (gastroesophageal reflux disease)   . Bronchiectasis   . Hypoglycemia associated with diabetes   . Osteopenia   . Fatigue   . Gastrostomy tube dependent   . Scoliosis    Past Surgical History  Procedure Laterality Date  . Gastrostomy tube placement    . Muscle biopsies    . Nissen fundoplication    . Pressure equalization tube    . Inguinal hernia repair    . Left tear duct     Family History  Problem Relation Age of Onset  . Cancer Neg Hx   . Diabetes Neg Hx   . Thyroid disease Neg Hx    History  Substance Use Topics  . Smoking status: Never Smoker   . Smokeless tobacco: Not on file  . Alcohol Use: No    Review of Systems  Unable to perform ROS: Patient nonverbal  Constitutional: Negative for fever.  Gastrointestinal: Negative for vomiting.  Musculoskeletal: Positive for gait problem.  Skin: Positive for wound.    Allergies  Augmentin; Cefuroxime axetil; Cephalosporins; Klonopin; Morphine and related; Sulfa antibiotics; and Tegretol  Home Medications   Current Outpatient Rx  Name  Route  Sig  Dispense  Refill   . albuterol (PROVENTIL) (5 MG/ML) 0.5% nebulizer solution   Nebulization   Take 2.5 mg by nebulization every 4 (four) hours as needed for wheezing or shortness of breath.         . budesonide (PULMICORT) 0.5 MG/2ML nebulizer solution   Nebulization   Take 0.5 mg by nebulization 2 (two) times daily.         . divalproex (DEPAKOTE SPRINKLE) 125 MG capsule   Per Tube   Place 250-325 mg into feeding tube 2 (two) times daily before lunch and supper. 2 tabs (250 mg) with lunch, and 3 tabs (325 mg) with dinner. Opens capsules and gives via G-tube         . divalproex (DEPAKOTE SPRINKLE) 125 MG capsule   Per Tube   Place 250-325 mg into feeding tube daily with breakfast. Alternates between 2 tabs (250 mg) and 3 tabs (325 mg) daily. Opens capsules and gives via G-tube         . erythromycin ophthalmic ointment   Left Eye   Place 1 application into the left eye as needed (Eye infection).         Marland Kitchen ibuprofen (ADVIL,MOTRIN) 100 MG/5ML suspension   Per Tube   Place 400 mg into feeding tube every 4 (four) hours as needed for fever or mild pain.          Marland Kitchen  insulin aspart (NOVOLOG) 100 UNIT/ML injection   Subcutaneous   Inject into the skin 3 (three) times daily before meals. Via home sliding scale insulin         . insulin glargine (LANTUS) 100 UNIT/ML injection   Subcutaneous   Inject 9.5 Units into the skin daily.         Marland Kitchen nystatin (MYCOSTATIN) powder   Topical   Apply topically as needed. Use around his G-tube and genitals when needed         . ofloxacin (FLOXIN) 0.3 % otic solution   Both Ears   Place 5 drops into both ears daily.         Marland Kitchen tobramycin (TOBREX) 0.3 % ophthalmic ointment   Left Eye   Place 1 application into the left eye as needed.         Marland Kitchen acetaminophen (TYLENOL) 160 MG/5ML solution   Oral   Take 640 mg by mouth every 6 (six) hours as needed.          BP 123/86  Pulse 111  Temp(Src) 97.5 F (36.4 C) (Axillary)  Resp 18  SpO2  92% Physical Exam  Nursing note and vitals reviewed. Constitutional: He appears well-developed and well-nourished.  HENT:  Head: Normocephalic.  Abrasion and contusion with mild swelling to right parietal region, no step off No epistaxis Neck supple, no signs of pain to palpation of vertebrae Full rom of neck without discomfort  Eyes: Conjunctivae are normal. Right eye exhibits no discharge. Left eye exhibits no discharge.  Neck: Normal range of motion. Neck supple. No tracheal deviation present.  Cardiovascular: Normal rate and regular rhythm.   Pulmonary/Chest: Effort normal and breath sounds normal.  Abdominal: Soft. He exhibits no distension. There is no tenderness. There is no guarding.  Musculoskeletal: He exhibits edema and tenderness.  Neurological: He is alert.  Non verbal, alert to voice, perrl Legs flexed, general weakness  Skin: Skin is warm. No rash noted.  Psychiatric: He has a normal mood and affect.    ED Course  Procedures (including critical care time) Labs Review Labs Reviewed - No data to display Imaging Review Dg Knee 1-2 Views Left  02/01/2013   CLINICAL DATA:  Bilateral knee lacerations and anterior bruising following a fall. Unable to straighten the knees.  EXAM: LEFT KNEE - 1-2 VIEW  COMPARISON:  Right knee radiographs obtained at the same time. These include the AP view of the left knee.  FINDINGS: Suboptimal visualization of the left knee in the frontal projection due to the inability to straighten the knee. There is diffuse osteopenia. There is a small amount of tendon ossification at the inferior aspect of the patella. There is also an obliquely oriented linear lucency crossing a portion of the inferior patella on the lateral view, not seen on the frontal view.  IMPRESSION: Possible nondisplaced fracture of the inferior left patella.   Electronically Signed   By: Gordan Payment M.D.   On: 02/01/2013 20:02   Dg Knee 1-2 Views Right  02/01/2013   CLINICAL  DATA:  Patient fell from wheelchair  EXAM: RIGHT KNEE - 1-2 VIEW  COMPARISON:  None.  FINDINGS: Diffuse osteopenia noted. No evidence of fracture dislocation of the left right knee. Cross-table lateral view of the right knee demonstrates no joint effusion.  IMPRESSION: No fracture or effusion. Osteopenia.   Electronically Signed   By: Genevive Bi M.D.   On: 02/01/2013 20:09   Ct Head Wo Contrast  02/01/2013  CLINICAL DATA:  Fall with right-sided hematoma.  EXAM: CT HEAD WITHOUT CONTRAST  TECHNIQUE: Contiguous axial images were obtained from the base of the skull through the vertex without intravenous contrast.  COMPARISON:  None.  FINDINGS: Positioning is atypical. There is a right frontal scalp hematoma. No underlying skull fracture. The calvarium is markedly thickened. No evidence of intracranial hemorrhage. No brain lesion is seen. No fluid in the sinuses.  IMPRESSION: Right frontal scalp swelling. No underlying skull fracture. Chronically PICC and calvarium. No intracranial injury evident.   Electronically Signed   By: Paulina Fusi M.D.   On: 02/01/2013 20:15    EKG Interpretation   None       MDM   1. Head injury, initial encounter   2. Scalp abrasion, initial encounter   3. Left patella fracture, closed, initial encounter    Mechanical fall. Pt at baseline per father.  No blood thinners.  Plan for CT head and xrays knees. Motrin G tube given, father says that is what he takes at home.   Head injury, Facial contusion    Enid Skeens, MD 02/01/13 2030

## 2013-02-23 ENCOUNTER — Other Ambulatory Visit: Payer: Self-pay | Admitting: "Endocrinology

## 2013-06-08 ENCOUNTER — Inpatient Hospital Stay (HOSPITAL_COMMUNITY)
Admission: EM | Admit: 2013-06-08 | Discharge: 2013-07-23 | DRG: 004 | Disposition: E | Payer: Managed Care, Other (non HMO) | Attending: Pulmonary Disease | Admitting: Pulmonary Disease

## 2013-06-08 ENCOUNTER — Encounter (HOSPITAL_COMMUNITY): Payer: Self-pay | Admitting: Emergency Medicine

## 2013-06-08 ENCOUNTER — Emergency Department (HOSPITAL_COMMUNITY): Payer: Managed Care, Other (non HMO)

## 2013-06-08 DIAGNOSIS — G713 Mitochondrial myopathy, not elsewhere classified: Secondary | ICD-10-CM | POA: Diagnosis present

## 2013-06-08 DIAGNOSIS — E8779 Other fluid overload: Secondary | ICD-10-CM | POA: Diagnosis not present

## 2013-06-08 DIAGNOSIS — E1049 Type 1 diabetes mellitus with other diabetic neurological complication: Secondary | ICD-10-CM | POA: Diagnosis present

## 2013-06-08 DIAGNOSIS — A419 Sepsis, unspecified organism: Principal | ICD-10-CM | POA: Diagnosis present

## 2013-06-08 DIAGNOSIS — D696 Thrombocytopenia, unspecified: Secondary | ICD-10-CM | POA: Diagnosis present

## 2013-06-08 DIAGNOSIS — Z888 Allergy status to other drugs, medicaments and biological substances status: Secondary | ICD-10-CM

## 2013-06-08 DIAGNOSIS — G909 Disorder of the autonomic nervous system, unspecified: Secondary | ICD-10-CM | POA: Diagnosis present

## 2013-06-08 DIAGNOSIS — IMO0002 Reserved for concepts with insufficient information to code with codable children: Secondary | ICD-10-CM | POA: Diagnosis present

## 2013-06-08 DIAGNOSIS — Z931 Gastrostomy status: Secondary | ICD-10-CM

## 2013-06-08 DIAGNOSIS — M62838 Other muscle spasm: Secondary | ICD-10-CM | POA: Diagnosis present

## 2013-06-08 DIAGNOSIS — F79 Unspecified intellectual disabilities: Secondary | ICD-10-CM

## 2013-06-08 DIAGNOSIS — E1043 Type 1 diabetes mellitus with diabetic autonomic (poly)neuropathy: Secondary | ICD-10-CM | POA: Diagnosis present

## 2013-06-08 DIAGNOSIS — R6520 Severe sepsis without septic shock: Secondary | ICD-10-CM

## 2013-06-08 DIAGNOSIS — M419 Scoliosis, unspecified: Secondary | ICD-10-CM

## 2013-06-08 DIAGNOSIS — R5383 Other fatigue: Secondary | ICD-10-CM

## 2013-06-08 DIAGNOSIS — E1069 Type 1 diabetes mellitus with other specified complication: Secondary | ICD-10-CM | POA: Diagnosis present

## 2013-06-08 DIAGNOSIS — E876 Hypokalemia: Secondary | ICD-10-CM | POA: Diagnosis not present

## 2013-06-08 DIAGNOSIS — Z9889 Other specified postprocedural states: Secondary | ICD-10-CM

## 2013-06-08 DIAGNOSIS — J122 Parainfluenza virus pneumonia: Secondary | ICD-10-CM

## 2013-06-08 DIAGNOSIS — Z794 Long term (current) use of insulin: Secondary | ICD-10-CM

## 2013-06-08 DIAGNOSIS — M899 Disorder of bone, unspecified: Secondary | ICD-10-CM | POA: Diagnosis present

## 2013-06-08 DIAGNOSIS — H9190 Unspecified hearing loss, unspecified ear: Secondary | ICD-10-CM | POA: Diagnosis present

## 2013-06-08 DIAGNOSIS — M4120 Other idiopathic scoliosis, site unspecified: Secondary | ICD-10-CM | POA: Diagnosis present

## 2013-06-08 DIAGNOSIS — E861 Hypovolemia: Secondary | ICD-10-CM | POA: Diagnosis present

## 2013-06-08 DIAGNOSIS — G40909 Epilepsy, unspecified, not intractable, without status epilepticus: Secondary | ICD-10-CM | POA: Diagnosis present

## 2013-06-08 DIAGNOSIS — T884XXA Failed or difficult intubation, initial encounter: Secondary | ICD-10-CM

## 2013-06-08 DIAGNOSIS — K219 Gastro-esophageal reflux disease without esophagitis: Secondary | ICD-10-CM | POA: Diagnosis present

## 2013-06-08 DIAGNOSIS — J180 Bronchopneumonia, unspecified organism: Secondary | ICD-10-CM | POA: Diagnosis present

## 2013-06-08 DIAGNOSIS — E1142 Type 2 diabetes mellitus with diabetic polyneuropathy: Secondary | ICD-10-CM | POA: Diagnosis present

## 2013-06-08 DIAGNOSIS — B9789 Other viral agents as the cause of diseases classified elsewhere: Secondary | ICD-10-CM | POA: Diagnosis present

## 2013-06-08 DIAGNOSIS — I469 Cardiac arrest, cause unspecified: Secondary | ICD-10-CM | POA: Diagnosis not present

## 2013-06-08 DIAGNOSIS — G809 Cerebral palsy, unspecified: Secondary | ICD-10-CM | POA: Diagnosis present

## 2013-06-08 DIAGNOSIS — K5900 Constipation, unspecified: Secondary | ICD-10-CM | POA: Diagnosis not present

## 2013-06-08 DIAGNOSIS — R1310 Dysphagia, unspecified: Secondary | ICD-10-CM | POA: Diagnosis present

## 2013-06-08 DIAGNOSIS — J9600 Acute respiratory failure, unspecified whether with hypoxia or hypercapnia: Secondary | ICD-10-CM | POA: Diagnosis present

## 2013-06-08 DIAGNOSIS — I10 Essential (primary) hypertension: Secondary | ICD-10-CM | POA: Diagnosis present

## 2013-06-08 DIAGNOSIS — M949 Disorder of cartilage, unspecified: Secondary | ICD-10-CM

## 2013-06-08 DIAGNOSIS — I498 Other specified cardiac arrhythmias: Secondary | ICD-10-CM | POA: Diagnosis present

## 2013-06-08 DIAGNOSIS — J9601 Acute respiratory failure with hypoxia: Secondary | ICD-10-CM | POA: Diagnosis present

## 2013-06-08 DIAGNOSIS — J189 Pneumonia, unspecified organism: Secondary | ICD-10-CM | POA: Diagnosis present

## 2013-06-08 DIAGNOSIS — E872 Acidosis, unspecified: Secondary | ICD-10-CM | POA: Diagnosis present

## 2013-06-08 DIAGNOSIS — G729 Myopathy, unspecified: Secondary | ICD-10-CM | POA: Diagnosis present

## 2013-06-08 DIAGNOSIS — J80 Acute respiratory distress syndrome: Secondary | ICD-10-CM

## 2013-06-08 DIAGNOSIS — E049 Nontoxic goiter, unspecified: Secondary | ICD-10-CM

## 2013-06-08 HISTORY — DX: Cerebral palsy, unspecified: G80.9

## 2013-06-08 LAB — VALPROIC ACID LEVEL: Valproic Acid Lvl: 108.7 ug/mL — ABNORMAL HIGH (ref 50.0–100.0)

## 2013-06-08 LAB — CBC WITH DIFFERENTIAL/PLATELET
BASOS ABS: 0.1 10*3/uL (ref 0.0–0.1)
BASOS PCT: 2 % — AB (ref 0–1)
EOS PCT: 0 % (ref 0–5)
Eosinophils Absolute: 0 10*3/uL (ref 0.0–0.7)
HCT: 45.5 % (ref 39.0–52.0)
Hemoglobin: 15.4 g/dL (ref 13.0–17.0)
LYMPHS PCT: 28 % (ref 12–46)
Lymphs Abs: 1.4 10*3/uL (ref 0.7–4.0)
MCH: 32.3 pg (ref 26.0–34.0)
MCHC: 33.8 g/dL (ref 30.0–36.0)
MCV: 95.4 fL (ref 78.0–100.0)
Monocytes Absolute: 0.9 10*3/uL (ref 0.1–1.0)
Monocytes Relative: 18 % — ABNORMAL HIGH (ref 3–12)
NEUTROS ABS: 2.7 10*3/uL (ref 1.7–7.7)
Neutrophils Relative %: 53 % (ref 43–77)
Platelets: 91 10*3/uL — ABNORMAL LOW (ref 150–400)
RBC Morphology: INCREASED
RBC: 4.77 MIL/uL (ref 4.22–5.81)
RDW: 14.3 % (ref 11.5–15.5)
WBC: 5 10*3/uL (ref 4.0–10.5)

## 2013-06-08 LAB — I-STAT ARTERIAL BLOOD GAS, ED
Acid-Base Excess: 5 mmol/L — ABNORMAL HIGH (ref 0.0–2.0)
Bicarbonate: 32.6 mEq/L — ABNORMAL HIGH (ref 20.0–24.0)
O2 Saturation: 78 %
PCO2 ART: 60 mmHg — AB (ref 35.0–45.0)
PO2 ART: 49 mmHg — AB (ref 80.0–100.0)
Patient temperature: 100.4
TCO2: 34 mmol/L (ref 0–100)
pH, Arterial: 7.348 — ABNORMAL LOW (ref 7.350–7.450)

## 2013-06-08 LAB — CBG MONITORING, ED: Glucose-Capillary: 192 mg/dL — ABNORMAL HIGH (ref 70–99)

## 2013-06-08 LAB — COMPREHENSIVE METABOLIC PANEL
ALT: 43 U/L (ref 0–53)
AST: 59 U/L — ABNORMAL HIGH (ref 0–37)
Albumin: 3.2 g/dL — ABNORMAL LOW (ref 3.5–5.2)
Alkaline Phosphatase: 160 U/L — ABNORMAL HIGH (ref 39–117)
BILIRUBIN TOTAL: 0.5 mg/dL (ref 0.3–1.2)
BUN: 16 mg/dL (ref 6–23)
CO2: 28 mEq/L (ref 19–32)
CREATININE: 0.4 mg/dL — AB (ref 0.50–1.35)
Calcium: 9.7 mg/dL (ref 8.4–10.5)
Chloride: 99 mEq/L (ref 96–112)
GFR calc Af Amer: >90 mL/min (ref >90)
GLUCOSE: 170 mg/dL — AB (ref 70–99)
Potassium: 4.3 mEq/L (ref 3.7–5.3)
Sodium: 141 mEq/L (ref 137–147)
Total Protein: 6.4 g/dL (ref 6.0–8.3)

## 2013-06-08 LAB — MRSA PCR SCREENING: MRSA by PCR: NEGATIVE

## 2013-06-08 LAB — GLUCOSE, CAPILLARY: GLUCOSE-CAPILLARY: 98 mg/dL (ref 70–99)

## 2013-06-08 LAB — I-STAT CG4 LACTIC ACID, ED: Lactic Acid, Venous: 2.27 mmol/L — ABNORMAL HIGH (ref 0.5–2.2)

## 2013-06-08 MED ORDER — SODIUM CHLORIDE 0.9 % IV SOLN: 1000.0000 mL | INTRAVENOUS | Status: DC

## 2013-06-08 MED ORDER — SODIUM CHLORIDE 0.9 % IV BOLUS (SEPSIS)
1000.0000 mL | Freq: Once | INTRAVENOUS | Status: AC
  Administered 2013-06-08: 1000 mL via INTRAVENOUS

## 2013-06-08 MED ORDER — ACETAMINOPHEN 160 MG/5ML PO SOLN
650.0000 mg | Freq: Once | ORAL | Status: AC
  Administered 2013-06-08: 650 mg
  Filled 2013-06-08: qty 20.3

## 2013-06-08 MED ORDER — ENOXAPARIN SODIUM 30 MG/0.3ML ~~LOC~~ SOLN
30.0000 mg | SUBCUTANEOUS | Status: DC
  Administered 2013-06-08: 30 mg via SUBCUTANEOUS
  Filled 2013-06-08 (×2): qty 0.3

## 2013-06-08 MED ORDER — LEVOFLOXACIN IN D5W 750 MG/150ML IV SOLN
750.0000 mg | INTRAVENOUS | Status: DC
  Administered 2013-06-09 – 2013-06-10 (×2): 750 mg via INTRAVENOUS
  Filled 2013-06-08 (×3): qty 150

## 2013-06-08 MED ORDER — LEVALBUTEROL HCL 0.63 MG/3ML IN NEBU
0.6300 mg | INHALATION_SOLUTION | RESPIRATORY_TRACT | Status: DC | PRN
  Administered 2013-06-12 – 2013-06-24 (×5): 0.63 mg via RESPIRATORY_TRACT
  Filled 2013-06-08 (×6): qty 3

## 2013-06-08 MED ORDER — VANCOMYCIN HCL 500 MG IV SOLR
500.0000 mg | Freq: Three times a day (TID) | INTRAVENOUS | Status: DC
  Administered 2013-06-09 (×2): 500 mg via INTRAVENOUS
  Filled 2013-06-08 (×5): qty 500

## 2013-06-08 MED ORDER — VALPROIC ACID 250 MG/5ML PO SYRP
333.0000 mg | ORAL_SOLUTION | Freq: Three times a day (TID) | ORAL | Status: DC
  Filled 2013-06-08 (×2): qty 7.5

## 2013-06-08 MED ORDER — BUDESONIDE 0.25 MG/2ML IN SUSP
0.2500 mg | Freq: Four times a day (QID) | RESPIRATORY_TRACT | Status: DC
  Administered 2013-06-08 – 2013-06-13 (×18): 0.25 mg via RESPIRATORY_TRACT
  Filled 2013-06-08 (×23): qty 2

## 2013-06-08 MED ORDER — LEVOFLOXACIN IN D5W 750 MG/150ML IV SOLN
750.0000 mg | Freq: Once | INTRAVENOUS | Status: AC
  Administered 2013-06-08: 750 mg via INTRAVENOUS
  Filled 2013-06-08: qty 150

## 2013-06-08 MED ORDER — SODIUM CHLORIDE 0.9 % IV SOLN
250.0000 mL | INTRAVENOUS | Status: DC | PRN
  Administered 2013-06-16 (×2): 500 mL via INTRAVENOUS
  Administered 2013-06-20: 250 mL via INTRAVENOUS
  Administered 2013-06-24: 500 mL via INTRAVENOUS

## 2013-06-08 MED ORDER — LEVALBUTEROL HCL 0.63 MG/3ML IN NEBU
0.6300 mg | INHALATION_SOLUTION | Freq: Four times a day (QID) | RESPIRATORY_TRACT | Status: DC
  Administered 2013-06-08 – 2013-06-12 (×12): 0.63 mg via RESPIRATORY_TRACT
  Filled 2013-06-08 (×23): qty 3

## 2013-06-08 MED ORDER — METOPROLOL TARTRATE 1 MG/ML IV SOLN
5.0000 mg | Freq: Once | INTRAVENOUS | Status: AC
  Administered 2013-06-08: 2.5 mg via INTRAVENOUS
  Filled 2013-06-08: qty 5

## 2013-06-08 MED ORDER — SODIUM CHLORIDE 0.9 % IV SOLN
1000.0000 mL | Freq: Once | INTRAVENOUS | Status: AC
  Administered 2013-06-08: 1000 mL via INTRAVENOUS

## 2013-06-08 MED ORDER — INSULIN ASPART 100 UNIT/ML ~~LOC~~ SOLN
0.0000 [IU] | SUBCUTANEOUS | Status: DC
  Administered 2013-06-08: 2 [IU] via SUBCUTANEOUS
  Administered 2013-06-09: 5 [IU] via SUBCUTANEOUS
  Administered 2013-06-09: 3 [IU] via SUBCUTANEOUS
  Administered 2013-06-09 (×2): 5 [IU] via SUBCUTANEOUS
  Administered 2013-06-10 (×3): 1 [IU] via SUBCUTANEOUS
  Administered 2013-06-10: 2 [IU] via SUBCUTANEOUS
  Administered 2013-06-11: 1 [IU] via SUBCUTANEOUS
  Administered 2013-06-11: 2 [IU] via SUBCUTANEOUS
  Administered 2013-06-11: 3 [IU] via SUBCUTANEOUS

## 2013-06-08 MED ORDER — ENOXAPARIN SODIUM 30 MG/0.3ML ~~LOC~~ SOLN: 30.0000 mg | SUBCUTANEOUS | Status: DC

## 2013-06-08 MED ORDER — DIVALPROEX SODIUM 125 MG PO CPSP
375.0000 mg | ORAL_CAPSULE | Freq: Two times a day (BID) | ORAL | Status: DC
  Administered 2013-06-08 – 2013-06-12 (×9): 375 mg via ORAL
  Filled 2013-06-08 (×14): qty 3

## 2013-06-08 MED ORDER — METOPROLOL TARTRATE 1 MG/ML IV SOLN
2.5000 mg | INTRAVENOUS | Status: DC | PRN
  Administered 2013-06-08 – 2013-06-11 (×6): 5 mg via INTRAVENOUS
  Filled 2013-06-08 (×6): qty 5

## 2013-06-08 MED ORDER — VANCOMYCIN HCL IN DEXTROSE 1-5 GM/200ML-% IV SOLN
1000.0000 mg | Freq: Once | INTRAVENOUS | Status: AC
  Administered 2013-06-08: 1000 mg via INTRAVENOUS
  Filled 2013-06-08: qty 200

## 2013-06-08 MED ORDER — INSULIN GLARGINE 100 UNIT/ML ~~LOC~~ SOLN
5.0000 [IU] | Freq: Every day | SUBCUTANEOUS | Status: DC
  Administered 2013-06-09: 5 [IU] via SUBCUTANEOUS
  Filled 2013-06-08 (×2): qty 0.05

## 2013-06-08 MED ORDER — DIVALPROEX SODIUM 125 MG PO CPSP
250.0000 mg | ORAL_CAPSULE | Freq: Every day | ORAL | Status: DC
  Administered 2013-06-10 – 2013-06-12 (×3): 250 mg via ORAL
  Filled 2013-06-08 (×6): qty 2

## 2013-06-08 MED ORDER — KCL IN DEXTROSE-NACL 10-5-0.45 MEQ/L-%-% IV SOLN
INTRAVENOUS | Status: DC
  Administered 2013-06-08: 21:00:00 via INTRAVENOUS
  Filled 2013-06-08 (×4): qty 1000

## 2013-06-08 NOTE — ED Notes (Signed)
Restraints applied verified by EMT and another RN both of who are certified to place restraints. CMS intact. Will continue to assess. O2 now 100% on NBR.

## 2013-06-08 NOTE — Progress Notes (Signed)
ANTIBIOTIC CONSULT NOTE - INITIAL  Pharmacy Consult for vancomycin and Levaquin Indication: pneumonia, CAP  Allergies  Allergen Reactions  . Augmentin [Amoxicillin-Pot Clavulanate]   . Cefuroxime Axetil   . Cephalosporins   . Klonopin [Clonazepam]   . Morphine And Related   . Sulfa Antibiotics   . Tegretol [Carbamazepine]     Patient Measurements: Height: 5' (152.4 cm) Weight: 100 lb (45.36 kg) IBW/kg (Calculated) : 50  Vital Signs: Temp: 100.4 F (38 C) (03/17 1508) Temp src: Rectal (03/17 1508) BP: 152/97 mmHg (03/17 1603) Pulse Rate: 158 (03/17 1603) I Labs:  Recent Labs  May 21, 2013 1452  WBC 5.0  HGB 15.4  PLT PENDING  CREATININE 0.40*   CrCl is unknown because no creatinine reading has been taken. No results found for this basename: VANCOTROUGH, VANCOPEAK, VANCORANDOM, GENTTROUGH, GENTPEAK, GENTRANDOM, TOBRATROUGH, TOBRAPEAK, TOBRARND, AMIKACINPEAK, AMIKACINTROU, AMIKACIN,  in the last 72 hours   Microbiology: No results found for this or any previous visit (from the past 720 hour(s)).  Medical History: Past Medical History  Diagnosis Date  . 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 Mitochondrial myopathy   . Mental retardation   . GERD (gastroesophageal reflux disease)   . Bronchiectasis   . Hypoglycemia associated with diabetes   . Osteopenia   . Fatigue   . Gastrostomy tube dependent   . Scoliosis     Medications:  Infusions:  . sodium chloride     Followed by  . sodium chloride    . levofloxacin (LEVAQUIN) IV 750 mg (May 21, 2013 1549)  . vancomycin     Assessment: 28 yo M with complicated past medical history including G-tube dependence, mental retardation, type 1 DM, and scoliosis [facility].  Patient presents with coughing, gurgling, and inability to expectorate successfully himself.  Pharmacy is consulted to dose vancomycin and Levaquin for CAP in a patient with  reported beta-lactam and sulfa allergies.  Initial labs reveal  WBC wnl and SCr 0.4 with estimated CrCl ~89.  Patient's temperature is currently 100.4. He is currently receiving one time dose of Levaquin IV 750mg  and has order for one time dose of vancomcyin 1gm.  Goal of Therapy:  Vancomycin trough level 15-20 mcg/ml Resolution of infection  Plan:  - continue Levaquin IV 750mg  q24h - continue with vancomycin IV 1gm in ED, followed by 500mg  q8h - [facility] - monitor kidney function, WBC, temperature curve, any cultures and clinical progression  Harrold DonathNathan E. Achilles Dunkope, PharmD Clinical Pharmacist - Resident Pager: [phone] Pharmacy: [phone] 10/28/2013 4:41 PM

## 2013-06-08 NOTE — ED Notes (Signed)
Critical care MD at bedside requesting pt receive Lopressor for high HR 2.5 mg at a time. He also requests we attempt to wean pt to nasal cannula. Pt placed on nasal cannula and appears to tolerate it well. O2 sats maintained will continue to assess.

## 2013-06-08 NOTE — H&P (Signed)
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 He also has severe kyphoscoliosis and severe hearing impairment. He is cared for @ home by his parents. He is [facility] by [facility] pediatrician, Dr Rana Snare. He presented to [facility] ED on day of admission with a 3-4 day history of fever, cough and progressive dyspnea to the point of respiratory distress on the morning of admission. He was initially seen in Dr Vance Gather office and sent to the ED by EMS where he was noted to be tachypneic, tachycardic, hypoxemic, febrile and agitated. He is admitted to ICU with presumptive dx of PNA. His mother notes a prior hx of aspiration PNA and lung abscess. His last hospitalization was approx 3 yrs ago for appendicitis.  PAST MEDICAL HISTORY :  Past Medical History  Diagnosis Date  . 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 Mitochondrial myopathy   . Mental retardation   . GERD (gastroesophageal reflux disease)   . Bronchiectasis   . Hypoglycemia associated with diabetes   . Osteopenia   . Fatigue   . Gastrostomy tube dependent   . Scoliosis         Recurrent ear infections  Past Surgical History  Procedure Laterality Date  . Gastrostomy tube placement    . Muscle biopsies    . Nissen fundoplication    . Pressure equalization tube    . Inguinal hernia repair    . Left tear duct     Prior to Admission medications   Medication Sig Start Date End Date Taking? Authorizing  Provider  acetaminophen (TYLENOL) 160 MG/5ML solution Take 640 mg by mouth every 6 (six) hours as needed.   Yes Historical Provider, MD  albuterol (PROVENTIL) (5 MG/ML) 0.5% nebulizer solution Take 2.5 mg by nebulization every 4 (four) hours as needed for wheezing or shortness of breath.   Yes Historical Provider, MD  budesonide (PULMICORT) 0.5 MG/2ML nebulizer solution Take 0.5 mg by nebulization 2 (two) times daily.   Yes Historical Provider, MD  divalproex (DEPAKOTE SPRINKLE) 125 MG capsule Place 250-325 mg into feeding tube 3 (three) times daily. 325mg  in the morning, 250mg  at lunch, 325mg  at dinner   Yes Historical Provider, MD  erythromycin ophthalmic ointment Place 1 application into the left eye as needed (Eye infection).   Yes Historical Provider, MD  ibuprofen (ADVIL,MOTRIN) 100 MG/5ML suspension Place 400 mg into feeding tube every 4 (four) hours as needed for fever or mild pain.    Yes Historical Provider, MD  insulin aspart (NOVOLOG) 100 UNIT/ML injection Inject 2-10 Units into the skin 5 (five) times daily. Via home sliding scale insulin   Yes Historical Provider, MD  insulin glargine (LANTUS) 100 UNIT/ML injection Inject 9-10 Units into the skin daily. *per sliding scale*   Yes Historical Provider, MD  nystatin (MYCOSTATIN) powder Apply topically as needed. Use around his G-tube and genitals when needed   Yes Historical Provider, MD  ofloxacin (FLOXIN) 0.3 % otic solution Place 5 drops into both ears daily.   Yes Historical Provider, MD  tobramycin (TOBREX) 0.3 % ophthalmic ointment Place 1 application into the left eye as needed.   Yes Historical Provider, MD   Allergies  Allergen Reactions  . Other Anaphylaxis    *all narcotics*  . Augmentin [Amoxicillin-Pot Clavulanate]   . Cefuroxime Axetil   . Cephalosporins   . Klonopin [Clonazepam]   . Morphine And Related   . Sulfa Antibiotics   . Tegretol [Carbamazepine]     FAMILY HISTORY:  Family History  Problem Relation Age of  Onset  . Cancer Neg Hx   . Diabetes Neg Hx   . Thyroid disease Neg Hx    SOCIAL HISTORY:  reports that he has never smoked. He does not have any smokeless tobacco history on file. He reports that he does not drink alcohol or use illicit drugs.  REVIEW OF SYSTEMS: Limited due to inability to speak. Per mother, increased lassitude over past couple of days  SUBJECTIVE:   VITAL SIGNS: Temp:  [98.1 F (36.7 C)-100.4 F (38 C)] 100.4 F (38 C) (03/17 1508) Pulse Rate:  [128-161] 147 (03/17 1845) Resp:  [24-43] 36 (03/17 1845) BP: (135-173)/(77-98) 164/97 mmHg (03/17 1845) SpO2:  [66 %-100 %] 91 % (03/17 1845) FiO2 (%):  [98 %] 98 % (03/17 1455) Weight:  [45.36 kg (100 lb)] 45.36 kg (100 lb) (03/17 1516) HEMODYNAMICS:   VENTILATOR SETTINGS: Vent Mode:  [-]  FiO2 (%):  [98 %] 98 % INTAKE / OUTPUT: Intake/Output   None     PHYSICAL EXAMINATION: General: Severely kyphoscoliotic. RASS +1. Fearful of examiner, moderately tachypneic Neuro: CNs intact, diffuse muscle atrophy, sensory grossly intact, DTRs not checked HEENT: NCAT, poor dentition, oral mucosa dry Neck: JVP cannot be assessed. No LAN or TM Cardiovascular: markedly tachycardic, regular, no M noted Lungs: diffuse coarse R>L rhonchi, no wheezes noted Abdomen: G tube site with slight leakage, soft, diminished BS Ext: contractures, muscle atrophy, symmetric brawny pedal edema Skin:  No lesions noted  LABS:  CBC  Recent Labs Lab 06/20/2013 1452  WBC 5.0  HGB 15.4  HCT 45.5  PLT 91*   Coag's No results found for this basename: APTT, INR,  in the last 168 hours BMET  Recent Labs Lab 05/24/2013 1452  NA 141  K 4.3  CL 99  CO2 28  BUN 16  CREATININE 0.40*  GLUCOSE 170*   Electrolytes  Recent Labs Lab 05/26/2013 1452  CALCIUM 9.7   Sepsis Markers  Recent Labs Lab 05/30/2013 1552  LATICACIDVEN 2.27*   ABG  Recent Labs Lab 06/20/2013 1533  PHART 7.348*  PCO2ART 60.0*  PO2ART 49.0*   Liver  Enzymes  Recent Labs Lab 05/26/2013 1452  AST 59*  ALT 43  ALKPHOS 160*  BILITOT 0.5  ALBUMIN 3.2*   Cardiac Enzymes No results found for this basename: TROPONINI, PROBNP,  in the last 168 hours Glucose  Recent Labs Lab 06/14/2013 1443 06/07/2013 1934  GLUCAP 192* 98     CXR: Difficult to interpret due to severe KS.  ASSESSMENT / PLAN:  PULMONARY A: Acute resp failure likely due to PNA P:   Supplemental O2 to maintain SpO2 > 90% Close ICU monitoring due to high risk of intubation Scheduled nebulized steroids and BDs (which he takes @ home)  CARDIOVASCULAR A: Sinus tachycardia - reactive (fever, agitation) Hypertension - reactive (agitation) P:  Treat underlying causes Tele monitoring Low dose PRN metoprolol  to maintain HR < 130/min  RENAL A: No acute issues P:   Maintenance IVFs ordered Monitor BMET intermittently Monitor I/Os Correct electrolytes as indicated   GASTROINTESTINAL A: Chronic dysphagia H/O Nissen fundoplication Chronic G tube P:   TFs per usual home schedule  HEMATOLOGIC A:  Thrombocytopenia - ? chronicity P:  Monitor CBC intermittently  INFECTIOUS A:  Severe sepsis Presumed PNA P:   Micro and abx as above  ENDOCRINE A:  DM1 P:   Lantus @ reduced dose until nutrition up to baseline caloric intake CBGs/SSI q 4 hrs - sens scale  NEUROLOGIC A: Cerebral palsy Muscle spasms Cognitive impairment Hearing impairment Agitation ("doesn't like doctors") P:   Cont VPA (usual dose is 375, 250, 375 mg) - simplified to 333 TID for now Minimize anxiety inducing stimuli Parents @ bedside to facilitate communication  TODAY'S SUMMARY:   I have personally obtained a history, examined the patient, evaluated laboratory and imaging results, formulated the assessment and plan and placed orders. CRITICAL CARE: The patient is critically ill with multiple organ systems failure and requires high complexity decision making for assessment and  support, frequent evaluation and titration of therapies, application of advanced monitoring technologies and extensive interpretation of multiple databases. Critical Care Time devoted to patient care services described in this note is 40 minutes.   Billy Fischeravid Simonds, MD ; [facility] service Mobile [phone].  After 5:30 PM or weekends, call [phone]  Pulmonary and Critical Care Medicine [facility] Pager: [phone]  06/15/2013, 8:08 PM

## 2013-06-08 NOTE — ED Notes (Signed)
Pts O2 sats in the 70s on blow by O2. EDP made aware. EDP and RN at bedside and placed NBR on patient. Pt repetitively removing NRB and not tolerating the mask well at all. In spite of attempts to orient patient by both RN and EDP patient continues to remove mask and O2 drops into the 70s. Due to patient's mental status and inability to understand that the mask must be left in place as well as the low O2 sats EDP ordered non-violent healthcare related restraints.

## 2013-06-08 NOTE — Progress Notes (Signed)
eLink Physician-Brief Progress Note Patient Name: Thomas Conley DOB: 1985-05-14 MRN: [id number]  Date of Service  06/22/2013   HPI/Events of Note   s tach  eICU Interventions  Lopressor prn Chest vest prn Spoke to mom/dad   Intervention Category Major Interventions: Arrhythmia - evaluation and management  [name] V. 06/13/2013, 8:33 PM

## 2013-06-08 NOTE — ED Notes (Signed)
To ED via [facility] from [facility] for resp distress. Pt nonverbal, father with pt at bedside. Pt has extensive medical history. Coughing, gurgling sound at back of throat--unable to expectorate by self.

## 2013-06-08 NOTE — ED Notes (Signed)
Dr. Ethelda ChickJacubowitz in to see pt. Orders received-- report given to Palestinian Territory[place]ittany Chandler, RN- pt's O2 sats drop from high 80's to 60's when blow by O2 is off. Pt becomes pale, dusky in color. Petechiae noted on legs- father states that is normal and has been evaluated by private md.

## 2013-06-08 NOTE — ED Provider Notes (Signed)
CSN: [id number]     Arrival date & time 07-05-13  1424 History   First MD Initiated Contact with Patient July 05, 2013 1458     Chief Complaint  Patient presents with  . Respiratory Distress   level V caveat patient noncommunicative.mentally retarded His is obtained from patient's father (Consider location/radiation/quality/duration/timing/severity/associated sxs/prior Treatment) HPI Patient with cough and dyspnea onset 2 days ago. No treatment prior to coming here. Seen at pediatricians office this morning. Sent here for further evaluation . No known fever no treatment prior to coming Past Medical History  Diagnosis Date  . 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 Mitochondrial myopathy   . Mental retardation   . GERD (gastroesophageal reflux disease)   . Bronchiectasis   . Hypoglycemia associated with diabetes   . Osteopenia   . Fatigue   . Gastrostomy tube dependent   . Scoliosis    Past Surgical History  Procedure Laterality Date  . Gastrostomy tube placement    . Muscle biopsies    . Nissen fundoplication    . Pressure equalization tube    . Inguinal hernia repair    . Left tear duct     Family History  Problem Relation Age of Onset  . Cancer Neg Hx   . Diabetes Neg Hx   . Thyroid disease Neg Hx    History  Substance Use Topics  . Smoking status: Never Smoker   . Smokeless tobacco: Not on file  . Alcohol Use: No    Review of Systems  Unable to perform ROS: Other  Respiratory: Positive for cough and shortness of breath.   Gastrointestinal:       Fed by G-tube  Musculoskeletal:       Severe scoliosis wheelchair-bound  Skin:       Chronic petechial rash   noncommunicative, mentally retarded.    Allergies  Augmentin; Cefuroxime axetil; Cephalosporins; Klonopin; Morphine and related; Sulfa antibiotics; and Tegretol  Home Medications   Current Outpatient Rx  Name  Route  Sig  Dispense  Refill  . acetaminophen (TYLENOL) 160 MG/5ML  solution   Oral   Take 640 mg by mouth every 6 (six) hours as needed.         Marland Kitchen albuterol (PROVENTIL) (5 MG/ML) 0.5% nebulizer solution   Nebulization   Take 2.5 mg by nebulization every 4 (four) hours as needed for wheezing or shortness of breath.         . budesonide (PULMICORT) 0.5 MG/2ML nebulizer solution   Nebulization   Take 0.5 mg by nebulization 2 (two) times daily.         . divalproex (DEPAKOTE SPRINKLE) 125 MG capsule   Per Tube   Place 250-325 mg into feeding tube 2 (two) times daily before lunch and supper. 2 tabs (250 mg) with lunch, and 3 tabs (325 mg) with dinner. Opens capsules and gives via G-tube         . divalproex (DEPAKOTE SPRINKLE) 125 MG capsule   Per Tube   Place 250-325 mg into feeding tube daily with breakfast. Alternates between 2 tabs (250 mg) and 3 tabs (325 mg) daily. Opens capsules and gives via G-tube         . erythromycin ophthalmic ointment   Left Eye   Place 1 application into the left eye as needed (Eye infection).         Marland Kitchen ibuprofen (ADVIL,MOTRIN) 100 MG/5ML suspension   Per Tube   Place 400 mg into  feeding tube every 4 (four) hours as needed for fever or mild pain.          Marland Kitchen insulin aspart (NOVOLOG) 100 UNIT/ML injection   Subcutaneous   Inject into the skin 3 (three) times daily before meals. Via home sliding scale insulin         . insulin glargine (LANTUS) 100 UNIT/ML injection   Subcutaneous   Inject 9.5 Units into the skin daily.         Marland Kitchen nystatin (MYCOSTATIN) powder   Topical   Apply topically as needed. Use around his G-tube and genitals when needed         . ofloxacin (FLOXIN) 0.3 % otic solution   Both Ears   Place 5 drops into both ears daily.         Marland Kitchen tobramycin (TOBREX) 0.3 % ophthalmic ointment   Left Eye   Place 1 application into the left eye as needed.          BP 135/77  Pulse 128  Temp(Src) 100.4 F (38 C) (Rectal)  Resp 24  SpO2 80% Physical Exam  Nursing note and vitals  reviewed. Constitutional: He appears distressed.  Chronically and acutely ill-appearing  HENT:  Head: Normocephalic and atraumatic.  Eyes: Conjunctivae are normal. Pupils are equal, round, and reactive to light.  Neck: Neck supple. No tracheal deviation present. No thyromegaly present.  Cardiovascular: Regular rhythm.   No murmur heard. Tachycardic  Pulmonary/Chest: Breath sounds normal. He is in respiratory distress.  Diffuse rhonchi coughing. Technique respirations counted at 32 breaths per minute by me  Abdominal: Soft. Bowel sounds are normal. He exhibits no distension. There is no tenderness.  G-tube present  Musculoskeletal: Normal range of motion. He exhibits no edema and no tenderness.  All 4 extremities with muscular atrophy  Neurological: He is alert. Coordination normal.  Skin: Rash noted.  Petechial rash on thighs  Psychiatric: He has a normal mood and affect.    ED Course  Procedures (including critical care time) Labs Review Labs Reviewed  CBG MONITORING, ED - Abnormal; Notable for the following:    Glucose-Capillary 192 (*)    All other components within normal limits  CULTURE, BLOOD (ROUTINE X 2)  CULTURE, BLOOD (ROUTINE X 2)  URINE CULTURE  CBC WITH DIFFERENTIAL  COMPREHENSIVE METABOLIC PANEL  URINALYSIS, ROUTINE W REFLEX MICROSCOPIC  VALPROIC ACID LEVEL  I-STAT CG4 LACTIC ACID, ED   Imaging Review No results found.   EKG Interpretation   Date/Time:  Tuesday June 08 2013 14:35:48 EDT Ventricular Rate:  141 PR Interval:  77 QRS Duration: 91 QT Interval:  292 QTC Calculation: 447 R Axis:   53 Text Interpretation:  Sinus tachycardia Borderline repolarization  abnormality Baseline wander in lead(s) V5 No old tracing to compare  Confirmed by Ethelda Chick  MD, Asuncion Tapscott [phone]) on 06/02/2013 3:25:56 PM      Chest xray viewed by me Results for orders placed during the hospital encounter of 06/15/2013  CBC WITH DIFFERENTIAL      Result Value Ref Range   WBC  5.0  4.0 - 10.5 K/uL   RBC 4.77  4.22 - 5.81 MIL/uL   Hemoglobin 15.4  13.0 - 17.0 g/dL   HCT 60.4  54.0 - 98.1 %   MCV 95.4  78.0 - 100.0 fL   MCH 32.3  26.0 - 34.0 pg   MCHC 33.8  30.0 - 36.0 g/dL   RDW 19.1  47.8 - 29.5 %   Platelets 91 (*)  150 - 400 K/uL   Neutrophils Relative % 53  43 - 77 %   Neutro Abs 2.7  1.7 - 7.7 K/uL   Lymphocytes Relative 28  12 - 46 %   Lymphs Abs 1.4  0.7 - 4.0 K/uL   Monocytes Relative 18 (*) 3 - 12 %   Monocytes Absolute 0.9  0.1 - 1.0 K/uL   Eosinophils Relative 0  0 - 5 %   Eosinophils Absolute 0.0  0.0 - 0.7 K/uL   Basophils Relative 2 (*) 0 - 1 %   Basophils Absolute 0.1  0.0 - 0.1 K/uL   WBC Morphology POLYCHROMASIA PRESENT     RBC Morphology INCREASED BANDS (>20% BANDS)    COMPREHENSIVE METABOLIC PANEL      Result Value Ref Range   Sodium 141  137 - 147 mEq/L   Potassium 4.3  3.7 - 5.3 mEq/L   Chloride 99  96 - 112 mEq/L   CO2 28  19 - 32 mEq/L   Glucose, Bld 170 (*) 70 - 99 mg/dL   BUN 16  6 - 23 mg/dL   Creatinine, Ser 1.610.40 (*) 0.50 - 1.35 mg/dL   Calcium 9.7  8.4 - 09.610.5 mg/dL   Total Protein 6.4  6.0 - 8.3 g/dL   Albumin 3.2 (*) 3.5 - 5.2 g/dL   AST 59 (*) 0 - 37 U/L   ALT 43  0 - 53 U/L   Alkaline Phosphatase 160 (*) 39 - 117 U/L   Total Bilirubin 0.5  0.3 - 1.2 mg/dL   GFR calc non Af Amer >90  >90 mL/min   GFR calc Af Amer >90  >90 mL/min  VALPROIC ACID LEVEL      Result Value Ref Range   Valproic Acid Lvl 108.7 (*) 50.0 - 100.0 ug/mL  CBG MONITORING, ED      Result Value Ref Range   Glucose-Capillary 192 (*) 70 - 99 mg/dL   Comment 1 Documented in Chart     Comment 2 Notify RN    I-STAT CG4 LACTIC ACID, ED      Result Value Ref Range   Lactic Acid, Venous 2.27 (*) 0.5 - 2.2 mmol/L  I-STAT ARTERIAL BLOOD GAS, ED      Result Value Ref Range   pH, Arterial 7.348 (*) 7.350 - 7.450   pCO2 arterial 60.0 (*) 35.0 - 45.0 mmHg   pO2, Arterial 49.0 (*) 80.0 - 100.0 mmHg   Bicarbonate 32.6 (*) 20.0 - 24.0 mEq/L   TCO2 34  0 -  100 mmol/L   O2 Saturation 78.0     Acid-Base Excess 5.0 (*) 0.0 - 2.0 mmol/L   Patient temperature 100.4 F     Collection site RADIAL, ALLEN'S TEST ACCEPTABLE     Drawn by RT     Sample type ARTERIAL     Comment NOTIFIED PHYSICIAN     Dg Chest Portable 1 View  07-18-2013   CLINICAL DATA:  Respiratory distress, severe scoliosis  EXAM: PORTABLE CHEST - 1 VIEW  COMPARISON:  12/20/2011  FINDINGS: Severe thoracic dextroscoliosis with patient rotation.  Lungs are grossly clear.  Heart is normal in size.  IMPRESSION: No evidence of acute cardiopulmonary disease.  Severe thoracic dextroscoliosis.   Electronically Signed   By: Charline BillsSriyesh  Krishnan M.D.   On: 004-26-2015 15:44    4:10 PM patient's pulse ox symmetry desaturates to 65% during tracheal suctioning by respiratory therapist, patient placed on nonrebreather with resultant pulse oximetry 100% on  nonrebreather mask  MDM  CODE sepsis called Final diagnoses:  None  Pulmonary critical care team called to consult for admission   soft restraints required as patient pulls off oxygen mask repeatedly Suspect pneumonia given fever, abnormal lung exam, hypoxia, cough as chest x-ray is poor quality due to patient's body habitus The patient will be admitted to intensive care unit.To be monitored for respiratory failure  Diagnosis #1 community acquired pneumonia #2 hypoxemia #3 respiratory acidosis #4 hyperglycemia  CRITICAL CARE Performed by: Doug Sou Total critical care time: 40 minute Critical care time was exclusive of separately billable procedures and treating other patients. Critical care was necessary to treat or prevent imminent or life-threatening deterioration. Critical care was time spent personally by me on the following activities: development of treatment plan with patient and/or surrogate as well as nursing, discussions with consultants, evaluation of patient's response to treatment, examination of patient, obtaining history from  patient or surrogate, ordering and performing treatments and interventions, ordering and review of laboratory studies, ordering and review of radiographic studies, pulse oximetry and re-evaluation of patient's condition.    Doug Sou, MD 05/29/2013 [phone]

## 2013-06-09 ENCOUNTER — Inpatient Hospital Stay (HOSPITAL_COMMUNITY): Payer: Managed Care, Other (non HMO)

## 2013-06-09 DIAGNOSIS — E049 Nontoxic goiter, unspecified: Secondary | ICD-10-CM

## 2013-06-09 DIAGNOSIS — R5381 Other malaise: Secondary | ICD-10-CM

## 2013-06-09 DIAGNOSIS — R5383 Other fatigue: Secondary | ICD-10-CM

## 2013-06-09 LAB — GLUCOSE, CAPILLARY
GLUCOSE-CAPILLARY: 185 mg/dL — AB (ref 70–99)
GLUCOSE-CAPILLARY: 257 mg/dL — AB (ref 70–99)
Glucose-Capillary: 108 mg/dL — ABNORMAL HIGH (ref 70–99)
Glucose-Capillary: 104 mg/dL — ABNORMAL HIGH (ref 70–99)
Glucose-Capillary: 217 mg/dL — ABNORMAL HIGH (ref 70–99)
Glucose-Capillary: 267 mg/dL — ABNORMAL HIGH (ref 70–99)

## 2013-06-09 LAB — CBC
HEMATOCRIT: 41.6 % (ref 39.0–52.0)
Hemoglobin: 13.6 g/dL (ref 13.0–17.0)
MCH: 31.6 pg (ref 26.0–34.0)
MCHC: 32.7 g/dL (ref 30.0–36.0)
MCV: 96.7 fL (ref 78.0–100.0)
Platelets: 73 10*3/uL — ABNORMAL LOW (ref 150–400)
RBC: 4.3 MIL/uL (ref 4.22–5.81)
RDW: 14.4 % (ref 11.5–15.5)
WBC: 6.7 10*3/uL (ref 4.0–10.5)

## 2013-06-09 LAB — BASIC METABOLIC PANEL
BUN: 9 mg/dL (ref 6–23)
CHLORIDE: 98 meq/L (ref 96–112)
CO2: 31 mEq/L (ref 19–32)
Calcium: 8.7 mg/dL (ref 8.4–10.5)
Creatinine, Ser: 0.39 mg/dL — ABNORMAL LOW (ref 0.50–1.35)
Glucose, Bld: 116 mg/dL — ABNORMAL HIGH (ref 70–99)
POTASSIUM: 4.3 meq/L (ref 3.7–5.3)
SODIUM: 138 meq/L (ref 137–147)

## 2013-06-09 LAB — VANCOMYCIN, TROUGH: Vancomycin Tr: <5 ug/mL — ABNORMAL LOW (ref 10.0–20.0)

## 2013-06-09 MED ORDER — ONDANSETRON HCL 4 MG/2ML IJ SOLN
4.0000 mg | Freq: Four times a day (QID) | INTRAMUSCULAR | Status: DC | PRN
  Administered 2013-06-10 – 2013-06-11 (×2): 4 mg via INTRAVENOUS
  Filled 2013-06-09 (×3): qty 2

## 2013-06-09 MED ORDER — NYSTATIN 100000 UNIT/GM EX POWD
CUTANEOUS | Status: DC | PRN
  Filled 2013-06-09: qty 15

## 2013-06-09 MED ORDER — INSULIN GLARGINE 100 UNIT/ML ~~LOC~~ SOLN
9.0000 [IU] | Freq: Every day | SUBCUTANEOUS | Status: DC
  Filled 2013-06-09: qty 0.09

## 2013-06-09 MED ORDER — VANCOMYCIN HCL IN DEXTROSE 750-5 MG/150ML-% IV SOLN
750.0000 mg | Freq: Three times a day (TID) | INTRAVENOUS | Status: DC
  Administered 2013-06-10: 750 mg via INTRAVENOUS
  Filled 2013-06-09 (×3): qty 150

## 2013-06-09 MED ORDER — HEPARIN SODIUM (PORCINE) 5000 UNIT/ML IJ SOLN
5000.0000 [IU] | Freq: Three times a day (TID) | INTRAMUSCULAR | Status: DC
  Administered 2013-06-09 – 2013-06-14 (×15): 5000 [IU] via SUBCUTANEOUS
  Filled 2013-06-09 (×21): qty 1

## 2013-06-09 MED ORDER — IBUPROFEN 100 MG/5ML PO SUSP
400.0000 mg | Freq: Three times a day (TID) | ORAL | Status: DC | PRN
  Administered 2013-06-09 – 2013-06-21 (×17): 400 mg
  Filled 2013-06-09 (×20): qty 20

## 2013-06-09 MED ORDER — SODIUM CHLORIDE 0.9 % IJ SOLN: 20.0000 mL | INTRAMUSCULAR | Status: DC | PRN

## 2013-06-09 MED ORDER — NUTREN 1.0 PO LIQD
250.0000 mL | Freq: Every day | ORAL | Status: DC
  Administered 2013-06-09: 120 mL via ORAL
  Filled 2013-06-09 (×2): qty 1

## 2013-06-09 MED ORDER — DEXTROSE 5 % IV SOLN
1.0000 g | Freq: Three times a day (TID) | INTRAVENOUS | Status: DC
  Administered 2013-06-09 – 2013-06-11 (×6): 1 g via INTRAVENOUS
  Filled 2013-06-09 (×9): qty 1

## 2013-06-09 MED ORDER — INSULIN GLARGINE 100 UNIT/ML ~~LOC~~ SOLN
4.0000 [IU] | Freq: Once | SUBCUTANEOUS | Status: AC
  Administered 2013-06-09: 4 [IU] via SUBCUTANEOUS
  Filled 2013-06-09: qty 0.04

## 2013-06-09 MED ORDER — VANCOMYCIN HCL IN DEXTROSE 750-5 MG/150ML-% IV SOLN
750.0000 mg | INTRAVENOUS | Status: AC
  Administered 2013-06-09: 750 mg via INTRAVENOUS
  Filled 2013-06-09: qty 150

## 2013-06-09 MED ORDER — NUTREN 1.0/FIBER PO LIQD
250.0000 mL | ORAL | Status: DC
  Administered 2013-06-09: 210 mL via ORAL
  Administered 2013-06-10: 250 mL via ORAL
  Filled 2013-06-09: qty 1

## 2013-06-09 NOTE — Progress Notes (Addendum)
INITIAL NUTRITION ASSESSMENT  DOCUMENTATION CODES Per approved criteria  -Not Applicable   INTERVENTION:  Continue home regimen: Nutren 1.0 with fiber 250 ml 4-5 boluses daily; Nutren 1.0 without fiber 250 ml once daily. Flush tube with water 20-40 ml after each bolus feeding.   Parents to bring in Nutren 1.0 formula from home and administer per their home regimen. Orders entered by Pharmacy.  NUTRITION DIAGNOSIS: Inadequate oral intake related to chronic swallowing difficulty as evidenced by PEG in place for nutrition and NPO status.   Goal: Intake to meet >90% of estimated nutrition needs.  Monitor:  TF tolerance/adequacy, weight trend, labs.  Reason for Assessment: MD Consult for TF initiation and management.  28 y.o. 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 He also has severe kyphoscoliosis and severe hearing impairment. He is cared for at home by his parents. [facility] Dr Rana SnareLowe. [facility] on day of admission with a 3-4 day history of fever, cough and progressive dyspnea to the point of respiratory distress on the morning of admission. Patient has a G tube for nutrition.  Received MD consult to resume usual home TF regimen. Per discussion with patient's mom and dad, he usually receives bolus feedings via PEG of 250 ml Nutren 1.0 with fiber 4-5 times daily and 250 ml Nutren 1.0 without fiber once daily. They flush tube with 20-40 ml of water after each feeding. This feeding regimen provides 1250-1500 kcals per day and 50-60 gm protein per day. Weight has been stable on this regimen. No wounds.  Height: Ht Readings from Last 1 Encounters:  05-30-2013 5' (1.524 m)    Weight: Wt Readings from Last 1 Encounters:  05-30-2013 101 lb 10.1 oz (46.1 kg)    Ideal Body Weight: 48.2 kg  % Ideal Body  Weight: 96%  Wt Readings from Last 10 Encounters:  05-30-2013 101 lb 10.1 oz (46.1 kg)    Usual Body Weight: 100 lb  % Usual Body Weight: 101%  BMI:  Body mass index is 19.85 kg/(m^2).  Estimated Nutritional Needs: Kcal: 1250-1500 Protein: 50-60 gm Fluid: 1.2-1.5 L  Skin: no wounds  Diet Order:  NPO  EDUCATION NEEDS: -No education needs identified at this time   Intake/Output Summary (Last 24 hours) at 06/09/13 1032 Last data filed at 06/09/13 0900  Gross per 24 hour  Intake  942.5 ml  Output      0 ml  Net  942.5 ml    Last BM: 3/17   Labs:   Recent Labs Lab 05-30-2013 1452 06/09/13 0220  NA 141 138  K 4.3 4.3  CL 99 98  CO2 28 31  BUN 16 9  CREATININE 0.40* 0.39*  CALCIUM 9.7 8.7  GLUCOSE 170* 116*    CBG (last 3)   Recent Labs  05-30-2013 2315 06/09/13 0335 06/09/13 0738  GLUCAP 185* 108* 104*    Scheduled Meds: . budesonide  0.25 mg Nebulization 4 times per day  . divalproex  250 mg Oral Q1200  . divalproex  375 mg Oral BID  . enoxaparin (LOVENOX) injection  30 mg Subcutaneous Q24H  . insulin aspart  0-9 Units Subcutaneous 6 times per day  . insulin glargine  5 Units Subcutaneous Daily  . levalbuterol  0.63 mg Nebulization 4 times per day  . levofloxacin (LEVAQUIN)  IV  750 mg Intravenous Q24H  . vancomycin  500 mg Intravenous Q8H    Continuous Infusions: . dextrose 5 % and 0.45 % NaCl with KCl 10 mEq/L 50 mL/hr at 06/09/13 0700    Past Medical History  Diagnosis Date  . 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 Mitochondrial myopathy   . Mental retardation   . GERD (gastroesophageal reflux disease)   . Bronchiectasis   . Hypoglycemia associated with diabetes   . Osteopenia   . Fatigue   . Gastrostomy tube dependent   . Scoliosis     Past Surgical History  Procedure Laterality Date  . Gastrostomy tube placement    . Muscle biopsies    . Nissen fundoplication    . Pressure equalization tube    .  Inguinal hernia repair    . Left tear duct       Joaquin Courts, RD, LDN, CNSC Pager [phone] After Hours Pager [phone]

## 2013-06-09 NOTE — Progress Notes (Signed)
UR Completed.  Thomas Conley [name] [phone] 06/09/2013  

## 2013-06-09 NOTE — H&P (Signed)
PULMONARY / CRITICAL CARE MEDICINE   Name: Thomas FermoDaniel F Villarruel MRN: [id number] DOB: May 20, 1985    ADMISSION DATE:  06/21/2013  REFERRING MD :  EDP PRIMARY SERVICE: PCCM  BRIEF PATIENT DESCRIPTION:  [age] M with cerebral palsy and severe kyphoscoliosis admitted via ED with severe respiratory distress likely due to PNA  SIGNIFICANT EVENTS / STUDIES:   LINES / TUBES:  CULTURES: Blood 3/17 >>  Viral 3/18>>> Urine strep 3/18>>>  ANTIBIOTICS: Vanc 3/17 >>  Levofloxacin 3/17 >>   SUBJECTIVE: no ett as of now  VITAL SIGNS: Temp:  [98.1 F (36.7 C)-100.6 F (38.1 C)] 99.3 F (37.4 C) (03/18 1304) Pulse Rate:  [109-169] 121 (03/18 1200) Resp:  [24-58] 32 (03/18 1200) BP: (101-176)/(60-105) 129/65 mmHg (03/18 1200) SpO2:  [66 %-100 %] 97 % (03/18 1200) FiO2 (%):  [98 %] 98 % (03/17 1455) Weight:  [45.36 kg (100 lb)-46.1 kg (101 lb 10.1 oz)] 46.1 kg (101 lb 10.1 oz) (03/17 2000) HEMODYNAMICS:   VENTILATOR SETTINGS: Vent Mode:  [-]  FiO2 (%):  [98 %] 98 % INTAKE / OUTPUT: Intake/Output     03/17 0701 - 03/18 0700 03/18 0701 - 03/19 0700   I.V. (mL/kg) 482.5 (10.5) 250 (5.4)   Other 130 210   NG/GT 30 20   IV Piggyback 100 100   Total Intake(mL/kg) 742.5 (16.1) 580 (12.6)   Net +742.5 +580        Urine Occurrence 3 x 2 x     PHYSICAL EXAMINATION: General: Severely kyphoscoliotic. RASS +1, tachypnea improved Neuro: CNs intact, diffuse muscle atrophy HEENT: NCAT, poor dentition Neck: JVP likely wnl Cardiovascular: markedly tachycardic, regular, no M noted Lungs: coarse ronchi Abdomen: G tube, soft, diminished BS Ext: contractures, muscle atrophy, symmetric brawny pedal edema Skin:  No lesions noted  LABS:  CBC  Recent Labs Lab 06/05/2013 1452 06/09/13 0220  WBC 5.0 6.7  HGB 15.4 13.6  HCT 45.5 41.6  PLT 91* 73*   Coag's No results found for this basename: APTT, INR,  in the last 168 hours BMET  Recent Labs Lab 06/14/2013 1452 06/09/13 0220  NA 141 138   K 4.3 4.3  CL 99 98  CO2 28 31  BUN 16 9  CREATININE 0.40* 0.39*  GLUCOSE 170* 116*   Electrolytes  Recent Labs Lab 06/04/2013 1452 06/09/13 0220  CALCIUM 9.7 8.7   Sepsis Markers  Recent Labs Lab 06/16/2013 1552  LATICACIDVEN 2.27*   ABG  Recent Labs Lab 06/06/2013 1533  PHART 7.348*  PCO2ART 60.0*  PO2ART 49.0*   Liver Enzymes  Recent Labs Lab 06/03/2013 1452  AST 59*  ALT 43  ALKPHOS 160*  BILITOT 0.5  ALBUMIN 3.2*   Cardiac Enzymes No results found for this basename: TROPONINI, PROBNP,  in the last 168 hours Glucose  Recent Labs Lab 05/25/2013 1443 06/07/2013 1934 06/07/2013 2315 06/09/13 0335 06/09/13 0738 06/09/13 1217  GLUCAP 192* 98 185* 108* 104* 217*     CXR: Difficult to interpret due to severe KS.  ASSESSMENT / PLAN:  PULMONARY A: Acute resp failure likely due to PNA, r/o viral, baseline restriction from K-S P:   Supplemental O2 to maintain SpO2 > 90% Scheduled nebulized steroids and BDs (which he takes @ home) pcxr not helpful, no need to repeat in am  Even balance goals No ett at this time  CARDIOVASCULAR A: Sinus tachycardia - reactive (fever, agitation), restirciton resp at baseline Hypertension - reactive (agitation) P:  Treat underlying causes Tele monitoring Low  dose PRN metoprolol to maintain HR < 130/min, avoid if able Treat resp failure  RENAL A: normal renal fx, reminder , low muscle mass P:   Maintenance IVFs ordered Chem in am   GASTROINTESTINAL A: Chronic dysphagia H/O Nissen fundoplication Chronic G tube P:   TFs per usual home schedule Asp precations  HEMATOLOGIC A:  Thrombocytopenia - ? Chronicity, med related? P:  Monitor CBC intermittently Sub q hep, dc lovenox with muscle mass May need to evaluate his Depakote with plat  INFECTIOUS A:  Severe sepsis Presumed PNA R/o viral P:   Continue vanc, levo Assess viral panel, sputum, strep ag Add aztreonam Low threshold addition IMIPnem if  declines  ENDOCRINE A:  DM1, glu 98-217 P:   Lantus @ reduced dose and glu up, T four on board, lantus increase to 9 CBGs/SSI q 4 hrs - sens scale  NEUROLOGIC A: Cerebral palsy Muscle spasms Cognitive impairment Hearing impairment Agitation ("doesn't like doctors") P:   Cont VPA (usual dose is 375, 250, 375 mg) - simplified to 333 TID for now, followplat Minimize anxiety inducing stimuli Parents @ bedside to facilitate communication  TODAY'S SUMMARY: consider to sdu, add abx  I have personally obtained a history, examined the patient, evaluated laboratory and imaging results, formulated the assessment and plan and placed orders. CRITICAL CARE:  Ebbie Cherry. Tyson Alias, MD, FACP Pgr: [phone]  Pulmonary & Critical Care  Pulmonary and Critical Care Medicine [facility] Pager: [phone]  06/09/2013, 2:27 PM

## 2013-06-09 NOTE — Progress Notes (Signed)
ANTIBIOTIC CONSULT NOTE - FOLLOW UP  Pharmacy Consult for Vancomycin Indication: pneumonia  Allergies  Allergen Reactions  . Other Anaphylaxis    *all narcotics*  . Augmentin [Amoxicillin-Pot Clavulanate]   . Cefuroxime Axetil   . Cephalosporins   . Klonopin [Clonazepam]   . Morphine And Related   . Sulfa Antibiotics   . Tegretol [Carbamazepine]     Patient Measurements: Height: 5' (152.4 cm) Weight: 101 lb 10.1 oz (46.1 kg) IBW/kg (Calculated) : 50 Adjusted Body Weight:   Vital Signs: Temp: 99.6 F (37.6 C) (03/18 1619) Temp src: Oral (03/18 1619) BP: 129/65 mmHg (03/18 1200) Pulse Rate: 121 (03/18 1200) Intake/Output from previous day: 03/17 0701 - 03/18 0700 In: 742.5 [I.V.:482.5; NG/GT:30; IV Piggyback:100] Out: -  Intake/Output from this shift: Total I/O In: 580 [I.V.:250; Other:210; NG/GT:20; IV Piggyback:100] Out: -   Labs:  Recent Labs  06/09/2013 1452 06/09/13 0220  WBC 5.0 6.7  HGB 15.4 13.6  PLT 91* 73*  CREATININE 0.40* 0.39*   Estimated Creatinine Clearance: 90.4 ml/min (by C-G formula based on Cr of 0.39).  Recent Labs  06/09/13 1538  VANCOTROUGH <5.0*     Microbiology: Recent Results (from the past 720 hour(s))  CULTURE, BLOOD (ROUTINE X 2)     Status: None   Collection Time    06/17/2013  3:10 PM      Result Value Ref Range Status   Specimen Description BLOOD ARM RIGHT   Final   Special Requests BOTTLES DRAWN AEROBIC AND ANAEROBIC 5CC   Final   Culture  Setup Time     Final   Value: 06/14/2013 22:38     Performed at [facility]   Culture     Final   Value:        BLOOD CULTURE RECEIVED NO GROWTH TO DATE CULTURE WILL BE HELD FOR 5 DAYS BEFORE ISSUING A FINAL NEGATIVE REPORT     Performed at [facility]   Report Status PENDING   Incomplete  CULTURE, BLOOD (ROUTINE X 2)     Status: None   Collection Time    05/28/2013  3:52 PM      Result Value Ref Range Status   Specimen Description BLOOD HAND RIGHT   Final   Special Requests BOTTLES DRAWN AEROBIC ONLY 5.5CC   Final   Culture  Setup Time     Final   Value: 05/23/2013 18:53     Performed at [facility]   Culture     Final   Value:        BLOOD CULTURE RECEIVED NO GROWTH TO DATE CULTURE WILL BE HELD FOR 5 DAYS BEFORE ISSUING A FINAL NEGATIVE REPORT     Performed at [facility]   Report Status PENDING   Incomplete  MRSA PCR SCREENING     Status: None   Collection Time    06/09/2013  7:28 PM      Result Value Ref Range Status   MRSA by PCR NEGATIVE  NEGATIVE Final   Comment:            The GeneXpert MRSA Assay (FDA     approved for NASAL specimens     only), is one component of a     comprehensive MRSA colonization     surveillance program. It is not     intended to diagnose MRSA     infection nor to guide or     monitor treatment for  MRSA infections.    Anti-infectives   Start     Dose/Rate Route Frequency Ordered Stop   06/09/13 1600  levofloxacin (LEVAQUIN) IVPB 750 mg     750 mg 100 mL/hr over 90 Minutes Intravenous Every 24 hours 05/29/2013 1700     06/09/13 1500  aztreonam (AZACTAM) 1 g in dextrose 5 % 50 mL IVPB     1 g 100 mL/hr over 30 Minutes Intravenous 3 times per day 06/09/13 1438     06/09/13 0100  vancomycin (VANCOCIN) 500 mg in sodium chloride 0.9 % 100 mL IVPB     500 mg 100 mL/hr over 60 Minutes Intravenous Every 8 hours 06/12/2013 1700     05/29/2013 1530  levofloxacin (LEVAQUIN) IVPB 750 mg     750 mg 100 mL/hr over 90 Minutes Intravenous  Once 06/02/2013 1515 06/04/2013 1716   06/01/2013 1530  vancomycin (VANCOCIN) IVPB 1000 mg/200 mL premix     1,000 mg 200 mL/hr over 60 Minutes Intravenous  Once 06/16/2013 1515 05/28/2013 1816      Assessment: 28yo male with cerebral palsy and severe kyphoscoliosis, completing day#1 of Vancomycin.  Cr stable at 0.39, though not a good indicator of renal function in this patient given low muscle mass.  Vanc Tr < 5.  Blood cx are NTD.  Goal of Therapy:  Vancomycin  trough level 15-20 mcg/ml  Plan:  1-  Increase Vancomycin to 750mg  IV q8 2-  Check ss level before 4th dose 3-  Watch for changes in Cr indicating worsening renal fxn vs actual value. 4-  F/U culture results  Marisue HumbleKendra Lief Palmatier, PharmD Clinical Pharmacist [facility]

## 2013-06-10 ENCOUNTER — Inpatient Hospital Stay (HOSPITAL_COMMUNITY): Payer: Managed Care, Other (non HMO)

## 2013-06-10 LAB — COMPREHENSIVE METABOLIC PANEL
ALT: 37 U/L (ref 0–53)
AST: 60 U/L — ABNORMAL HIGH (ref 0–37)
Albumin: 2.5 g/dL — ABNORMAL LOW (ref 3.5–5.2)
Alkaline Phosphatase: 116 U/L (ref 39–117)
BILIRUBIN TOTAL: 0.3 mg/dL (ref 0.3–1.2)
BUN: 14 mg/dL (ref 6–23)
CHLORIDE: 93 meq/L — AB (ref 96–112)
CO2: 37 mEq/L — ABNORMAL HIGH (ref 19–32)
Calcium: 9.1 mg/dL (ref 8.4–10.5)
Creatinine, Ser: 0.39 mg/dL — ABNORMAL LOW (ref 0.50–1.35)
Glucose, Bld: 170 mg/dL — ABNORMAL HIGH (ref 70–99)
Potassium: 4.7 mEq/L (ref 3.7–5.3)
Sodium: 136 mEq/L — ABNORMAL LOW (ref 137–147)
Total Protein: 5.2 g/dL — ABNORMAL LOW (ref 6.0–8.3)

## 2013-06-10 LAB — CBC WITH DIFFERENTIAL/PLATELET
Basophils Absolute: 0 10*3/uL (ref 0.0–0.1)
Basophils Relative: 0 % (ref 0–1)
EOS ABS: 0 10*3/uL (ref 0.0–0.7)
Eosinophils Relative: 0 % (ref 0–5)
HCT: 38.4 % — ABNORMAL LOW (ref 39.0–52.0)
Hemoglobin: 12.2 g/dL — ABNORMAL LOW (ref 13.0–17.0)
LYMPHS PCT: 35 % (ref 12–46)
Lymphs Abs: 2.3 10*3/uL (ref 0.7–4.0)
MCH: 31.4 pg (ref 26.0–34.0)
MCHC: 31.8 g/dL (ref 30.0–36.0)
MCV: 98.7 fL (ref 78.0–100.0)
Monocytes Absolute: 1.3 10*3/uL — ABNORMAL HIGH (ref 0.1–1.0)
Monocytes Relative: 20 % — ABNORMAL HIGH (ref 3–12)
NEUTROS PCT: 45 % (ref 43–77)
Neutro Abs: 3 10*3/uL (ref 1.7–7.7)
PLATELETS: 57 10*3/uL — AB (ref 150–400)
RBC: 3.89 MIL/uL — ABNORMAL LOW (ref 4.22–5.81)
RDW: 14.2 % (ref 11.5–15.5)
WBC: 6.7 10*3/uL (ref 4.0–10.5)

## 2013-06-10 LAB — BASIC METABOLIC PANEL
BUN: 13 mg/dL (ref 6–23)
CHLORIDE: 95 meq/L — AB (ref 96–112)
CO2: 35 meq/L — AB (ref 19–32)
Calcium: 9.1 mg/dL (ref 8.4–10.5)
Creatinine, Ser: 0.33 mg/dL — ABNORMAL LOW (ref 0.50–1.35)
GFR calc Af Amer: >90 mL/min (ref >90)
GFR calc non Af Amer: >90 mL/min (ref >90)
Glucose, Bld: 149 mg/dL — ABNORMAL HIGH (ref 70–99)
Potassium: 4.6 mEq/L (ref 3.7–5.3)
SODIUM: 137 meq/L (ref 137–147)

## 2013-06-10 LAB — GLUCOSE, CAPILLARY
GLUCOSE-CAPILLARY: 231 mg/dL — AB (ref 70–99)
GLUCOSE-CAPILLARY: 122 mg/dL — AB (ref 70–99)
GLUCOSE-CAPILLARY: 166 mg/dL — AB (ref 70–99)
Glucose-Capillary: 71 mg/dL (ref 70–99)
Glucose-Capillary: 148 mg/dL — ABNORMAL HIGH (ref 70–99)
Glucose-Capillary: 133 mg/dL — ABNORMAL HIGH (ref 70–99)

## 2013-06-10 LAB — POCT I-STAT 3, ART BLOOD GAS (G3+)
Acid-Base Excess: 11 mmol/L — ABNORMAL HIGH (ref 0.0–2.0)
Bicarbonate: 40.3 mEq/L — ABNORMAL HIGH (ref 20.0–24.0)
O2 SAT: 95 %
Patient temperature: 99.5
TCO2: 43 mmol/L (ref 0–100)
pCO2 arterial: 76.7 mmHg (ref 35.0–45.0)
pH, Arterial: 7.331 — ABNORMAL LOW (ref 7.350–7.450)
pO2, Arterial: 85 mmHg (ref 80.0–100.0)

## 2013-06-10 MED ORDER — DEXTROSE-NACL 5-0.9 % IV SOLN
INTRAVENOUS | Status: DC
  Administered 2013-06-10 – 2013-06-13 (×2): via INTRAVENOUS
  Administered 2013-06-13: 500 mL via INTRAVENOUS
  Administered 2013-06-14: 21:00:00 via INTRAVENOUS

## 2013-06-10 MED ORDER — WHITE PETROLATUM GEL
Status: AC
  Filled 2013-06-10: qty 5

## 2013-06-10 MED ORDER — SODIUM CHLORIDE 0.9 % IV SOLN: INTRAVENOUS | Status: DC

## 2013-06-10 MED ORDER — NUTREN 1.0/FIBER PO LIQD
100.0000 mL | ORAL | Status: DC
  Administered 2013-06-10 – 2013-06-11 (×3): 100 mL via ORAL

## 2013-06-10 MED ORDER — PANTOPRAZOLE SODIUM 40 MG IV SOLR
40.0000 mg | INTRAVENOUS | Status: DC
  Administered 2013-06-10 – 2013-06-14 (×5): 40 mg via INTRAVENOUS
  Filled 2013-06-10 (×5): qty 40

## 2013-06-10 NOTE — Progress Notes (Addendum)
Patient was sleeping at time of CPT, parent asked to not wake him. Will do CPT at next scheduled time.   Patient woke up during treatment. CPT done at this time with dad at bedside.

## 2013-06-10 NOTE — Progress Notes (Signed)
PULMONARY / CRITICAL CARE MEDICINE   Name: Thomas Conley MRN: [id number] DOB: 11/16/1985    ADMISSION DATE:  2013/06/19  REFERRING MD :  EDP PRIMARY SERVICE: PCCM  BRIEF PATIENT DESCRIPTION:  65 M with cerebral palsy and severe kyphoscoliosis admitted via ED with severe respiratory distress likely due to PNA  SIGNIFICANT EVENTS / STUDIES:   LINES / TUBES:  CULTURES: Blood 3/17 >>  Viral 3/18>>> Urine strep 3/18>>>p  ANTIBIOTICS: Vanc 3/17 >>  Levofloxacin 3/17 >>  Aztreonam 3/18>>>  SUBJECTIVE: int distress  VITAL SIGNS: Temp:  [97.6 F (36.4 C)-99.9 F (37.7 C)] 97.7 F (36.5 C) (03/19 0852) Pulse Rate:  [92-161] 107 (03/19 0800) Resp:  [19-44] 25 (03/19 0800) BP: (97-162)/(51-87) 126/77 mmHg (03/19 0800) SpO2:  [84 %-100 %] 100 % (03/19 0903) HEMODYNAMICS:   VENTILATOR SETTINGS:   INTAKE / OUTPUT: Intake/Output     03/18 0701 - 03/19 0700 03/19 0701 - 03/20 0700   I.V. (mL/kg) 900 (19.5) 50 (1.1)   Other 210    NG/GT 20    IV Piggyback 550    Total Intake(mL/kg) 1680 (36.4) 50 (1.1)   Net +1680 +50        Urine Occurrence 6 x    Stool Occurrence 3 x      PHYSICAL EXAMINATION: General: Severely kyphoscoliotic. RASS +1, tachypnea improved overall Neuro: CNs intact, diffuse muscle atrophy HEENT: NCAT, poor dentition Neck: JVP likely wnl Cardiovascular: improved tachy s1 s 2 Lungs: coarse ronchi better Abdomen: G tube, soft, diminished BS Ext: contractures, muscle atrophy, symmetric brawny pedal edema Skin:  No lesions noted  LABS:  CBC  Recent Labs Lab 06-19-13 1452 06/09/13 0220 06/10/13 0237  WBC 5.0 6.7 6.7  HGB 15.4 13.6 12.2*  HCT 45.5 41.6 38.4*  PLT 91* 73* 57*   Coag's No results found for this basename: APTT, INR,  in the last 168 hours BMET  Recent Labs Lab 06/19/2013 1452 06/09/13 0220 06/10/13 0237  NA 141 138 136*  K 4.3 4.3 4.7  CL 99 98 93*  CO2 28 31 37*  BUN 16 9 14   CREATININE 0.40* 0.39* 0.39*   GLUCOSE 170* 116* 170*   Electrolytes  Recent Labs Lab 2013-06-19 1452 06/09/13 0220 06/10/13 0237  CALCIUM 9.7 8.7 9.1   Sepsis Markers  Recent Labs Lab June 19, 2013 1552  LATICACIDVEN 2.27*   ABG  Recent Labs Lab 06/19/13 1533 06/10/13 0012  PHART 7.348* 7.331*  PCO2ART 60.0* 76.7*  PO2ART 49.0* 85.0   Liver Enzymes  Recent Labs Lab 06/19/2013 1452 06/10/13 0237  AST 59* 60*  ALT 43 37  ALKPHOS 160* 116  BILITOT 0.5 0.3  ALBUMIN 3.2* 2.5*   Cardiac Enzymes No results found for this basename: TROPONINI, PROBNP,  in the last 168 hours Glucose  Recent Labs Lab 06/09/13 1217 06/09/13 1529 06/09/13 1935 06/09/13 2314 06/10/13 0331 06/10/13 0742  GLUCAP 217* 267* 257* 231* 122* 71     CXR: Difficult to interpret due to severe KS., new rt base infiltrate  ASSESSMENT / PLAN:  PULMONARY A: Acute resp failure likely due to PNA, r/o viral, baseline restriction from K-S P:   Bolus TF seem to increase RR and distress- dc them , especially in ICU pcxr noted Clinically improved overall Would offer BIPAP if worsen, but have concerns airway control See ID Mom and dad want to avoid intubation unless absolutely necessary abg re assuring, shows acute (mild) on chronic from restriction  CARDIOVASCULAR A: Sinus tachycardia - reactive (  fever, agitation), restirciton resp at baseline Hypertension - reactive (agitation) P:  Treat underlying cause- agitations Tele monitoring Metoprolol , did well with this Treat resp failure D/w Dr Rana SnareLowe, primary, she will fax last echo, asd etc?  RENAL A: normal renal fx, reminder , low muscle mass P:   Maintenance IVFs to kvo Dc K from bag Chem in am   GASTROINTESTINAL A: Chronic dysphagia H/O Nissen fundoplication Chronic G tube P:   Dc bolus TF, in am consider restart continuous TF  Consult nutirtion Asp precautions ppi consideration while NPO  HEMATOLOGIC A:  Thrombocytopenia - ? Chronicity, med related?  Dilution now? P:  Monitor CBC intermittently Sub q hep May need lasix in am   INFECTIOUS A:  Severe sepsis Presumed PNA R/o viral P:   Continue levo, aztreonam Dc vanc Assess viral panel, sputum, strep ag - awaited D/w Dr Rana SnareLowe, could provide cephalosporin if needed, will hold off as some clinical progress with current regimen Stools formed  ENDOCRINE A:  DM1, glu to 70 P:   Hold lantus when NPO  NEUROLOGIC A: Cerebral palsy Muscle spasms Cognitive impairment Hearing impairment Agitation ("doesn't like doctors") P:   Cont VPA (usual dose is 375, 250, 375 mg) - simplified to 333 TID for now, followplat Minimize anxiety inducing stimuli Parents updated extensive  TODAY'S SUMMARY: may consider sdu in am , d/w Dr Rana SnareLowe, much appreciated , [place], dc vanc, avoid bolus TF  I have personally obtained a history, examined the patient, evaluated laboratory and imaging results, formulated the assessment and plan and placed orders. CRITICAL CARE:  Mcarthur RossettiDaniel J. Tyson AliasFeinstein, MD, FACP Pgr: [phone] [place] [facility]  Pulmonary and Critical Care Medicine [facility] Pager: [phone]  06/10/2013, 9:56 AM

## 2013-06-10 NOTE — Progress Notes (Signed)
NUTRITION FOLLOW UP  Intervention:    Give 100 ml Nutren with fiber as needed with medications.  When able to resume feedings, Continue home regimen: Nutren 1.0 with fiber 250 ml 4 boluses daily; Nutren 1.0 without fiber 250 ml once daily. Flush tube with water 20-40 ml after each bolus feeding. Parents to supply Nutren.  Nutrition Dx:   Inadequate oral intake related to chronic swallowing difficulty as evidenced by PEG in place for nutrition and NPO status. Ongoing.  Goal:   Intake to meet >90% of estimated nutrition needs. Unmet.  Monitor:   TF tolerance/adequacy, weight trend, labs.  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 He also has severe kyphoscoliosis and severe hearing impairment. He is cared for at home by his parents. [facility], Dr Rana SnareLowe. [facility] day [facility] with a 3-4 day history of fever, cough and progressive dyspnea to the point of respiratory distress on the morning of admission. Patient has a G tube for nutrition.  Patient had some abdominal distention and pain with tube feeding yesterday, so plans are to hold TF today except giving a small amount with medications. Patient's dad prefers to use home formula for feedings via PEG.  Height: Ht Readings from Last 1 Encounters:  05/25/2013 5' (1.524 m)    Weight Status:   Wt Readings from Last 1 Encounters:  06/22/2013 101 lb 10.1 oz (46.1 kg)    Re-estimated needs:  Kcal: 1250-1500  Protein: 50-60 gm  Fluid: 1.2-1.5 L  Skin: no wounds  Diet Order:  NPO   Intake/Output Summary (Last 24 hours) at 06/10/13 1053 Last data filed at 06/10/13 0900  Gross per 24 hour  Intake   1350 ml  Output      0 ml  Net   1350 ml    Last BM: 3/18   Labs:   Recent Labs Lab 06/09/2013 1452 06/09/13 0220 06/10/13 0237  NA 141 138 136*  K 4.3 4.3 4.7  CL 99 98 93*  CO2 28 31 37*  BUN 16 9 14    CREATININE 0.40* 0.39* 0.39*  CALCIUM 9.7 8.7 9.1  GLUCOSE 170* 116* 170*    CBG (last 3)   Recent Labs  06/09/13 2314 06/10/13 0331 06/10/13 0742  GLUCAP 231* 122* 71    Scheduled Meds: . aztreonam  1 g Intravenous 3 times per day  . budesonide  0.25 mg Nebulization 4 times per day  . divalproex  250 mg Oral Q1200  . divalproex  375 mg Oral BID  . heparin subcutaneous  5,000 Units Subcutaneous 3 times per day  . insulin aspart  0-9 Units Subcutaneous 6 times per day  . levalbuterol  0.63 mg Nebulization 4 times per day  . levofloxacin (LEVAQUIN) IV  750 mg Intravenous Q24H  . NUTREN 1.0/FIBER  100 mL Oral 3 times per day  . pantoprazole (PROTONIX) IV  40 mg Intravenous Q24H    Continuous Infusions: . dextrose 5 % and 0.9% NaCl      Joaquin CourtsKimberly Harris, RD, LDN, CNSC Pager [phone] After Hours Pager [phone]

## 2013-06-10 NOTE — Progress Notes (Signed)
Pt had a prolonged episode of gagging/ ? Nausea (no emesis) during levaquin infusion.  Pt's mom refused for pt to receive zofran b/c he had never had it before and she was worried he would have a reaction to it.

## 2013-06-11 ENCOUNTER — Inpatient Hospital Stay (HOSPITAL_COMMUNITY): Payer: Managed Care, Other (non HMO)

## 2013-06-11 DIAGNOSIS — Z931 Gastrostomy status: Secondary | ICD-10-CM

## 2013-06-11 DIAGNOSIS — K219 Gastro-esophageal reflux disease without esophagitis: Secondary | ICD-10-CM

## 2013-06-11 DIAGNOSIS — Z9889 Other specified postprocedural states: Secondary | ICD-10-CM

## 2013-06-11 DIAGNOSIS — J189 Pneumonia, unspecified organism: Secondary | ICD-10-CM

## 2013-06-11 DIAGNOSIS — J9600 Acute respiratory failure, unspecified whether with hypoxia or hypercapnia: Secondary | ICD-10-CM

## 2013-06-11 LAB — RESPIRATORY VIRUS PANEL
Adenovirus: NOT DETECTED
INFLUENZA A H3: NOT DETECTED
Influenza A H1: NOT DETECTED
Influenza A: NOT DETECTED
Influenza B: NOT DETECTED
Metapneumovirus: NOT DETECTED
PARAINFLUENZA 1 A: NOT DETECTED
Parainfluenza 2: NOT DETECTED
Parainfluenza 3: DETECTED — AB
RESPIRATORY SYNCYTIAL VIRUS B: NOT DETECTED
RHINOVIRUS: NOT DETECTED
Respiratory Syncytial Virus A: NOT DETECTED

## 2013-06-11 LAB — CBC WITH DIFFERENTIAL/PLATELET
BASOS PCT: 0 % (ref 0–1)
Basophils Absolute: 0 10*3/uL (ref 0.0–0.1)
EOS ABS: 0 10*3/uL (ref 0.0–0.7)
Eosinophils Relative: 0 % (ref 0–5)
HCT: 38.6 % — ABNORMAL LOW (ref 39.0–52.0)
HEMOGLOBIN: 12.2 g/dL — AB (ref 13.0–17.0)
LYMPHS ABS: 2.4 10*3/uL (ref 0.7–4.0)
Lymphocytes Relative: 39 % (ref 12–46)
MCH: 31.8 pg (ref 26.0–34.0)
MCHC: 31.6 g/dL (ref 30.0–36.0)
MCV: 100.5 fL — ABNORMAL HIGH (ref 78.0–100.0)
MONOS PCT: 21 % — AB (ref 3–12)
Monocytes Absolute: 1.3 10*3/uL — ABNORMAL HIGH (ref 0.1–1.0)
NEUTROS ABS: 2.5 10*3/uL (ref 1.7–7.7)
NEUTROS PCT: 40 % — AB (ref 43–77)
Platelets: 51 10*3/uL — ABNORMAL LOW (ref 150–400)
RBC: 3.84 MIL/uL — AB (ref 4.22–5.81)
RDW: 14 % (ref 11.5–15.5)
WBC: 6.3 10*3/uL (ref 4.0–10.5)

## 2013-06-11 LAB — GLUCOSE, CAPILLARY
GLUCOSE-CAPILLARY: 242 mg/dL — AB (ref 70–99)
GLUCOSE-CAPILLARY: 240 mg/dL — AB (ref 70–99)
Glucose-Capillary: 136 mg/dL — ABNORMAL HIGH (ref 70–99)
Glucose-Capillary: 150 mg/dL — ABNORMAL HIGH (ref 70–99)
Glucose-Capillary: 160 mg/dL — ABNORMAL HIGH (ref 70–99)
Glucose-Capillary: 84 mg/dL (ref 70–99)
Glucose-Capillary: 97 mg/dL (ref 70–99)

## 2013-06-11 MED ORDER — INSULIN ASPART 100 UNIT/ML ~~LOC~~ SOLN
0.0000 [IU] | SUBCUTANEOUS | Status: DC
  Administered 2013-06-11: 4 [IU] via SUBCUTANEOUS
  Administered 2013-06-12: 7 [IU] via SUBCUTANEOUS
  Administered 2013-06-12: 4 [IU] via SUBCUTANEOUS
  Administered 2013-06-12: 7 [IU] via SUBCUTANEOUS
  Administered 2013-06-12: 4 [IU] via SUBCUTANEOUS
  Administered 2013-06-12: 7 [IU] via SUBCUTANEOUS
  Administered 2013-06-12: 3 [IU] via SUBCUTANEOUS
  Administered 2013-06-13: 2 [IU] via SUBCUTANEOUS
  Administered 2013-06-13 (×2): 4 [IU] via SUBCUTANEOUS
  Administered 2013-06-13: 3 [IU] via SUBCUTANEOUS
  Administered 2013-06-13 – 2013-06-14 (×2): 4 [IU] via SUBCUTANEOUS
  Administered 2013-06-14: 11 [IU] via SUBCUTANEOUS
  Administered 2013-06-14: 7 [IU] via SUBCUTANEOUS
  Administered 2013-06-14 (×2): 11 [IU] via SUBCUTANEOUS
  Administered 2013-06-15: 7 [IU] via SUBCUTANEOUS
  Administered 2013-06-15 – 2013-06-16 (×6): 11 [IU] via SUBCUTANEOUS
  Administered 2013-06-16: 15 [IU] via SUBCUTANEOUS
  Administered 2013-06-16: 11 [IU] via SUBCUTANEOUS
  Administered 2013-06-16: 4 [IU] via SUBCUTANEOUS
  Administered 2013-06-16 (×2): 11 [IU] via SUBCUTANEOUS
  Administered 2013-06-17 (×2): 7 [IU] via SUBCUTANEOUS
  Administered 2013-06-17: 11 [IU] via SUBCUTANEOUS
  Administered 2013-06-17: 4 [IU] via SUBCUTANEOUS
  Administered 2013-06-17 – 2013-06-18 (×3): 7 [IU] via SUBCUTANEOUS
  Administered 2013-06-18 (×2): 4 [IU] via SUBCUTANEOUS
  Administered 2013-06-18: 7 [IU] via SUBCUTANEOUS
  Administered 2013-06-18: 4 [IU] via SUBCUTANEOUS
  Administered 2013-06-18: 7 [IU] via SUBCUTANEOUS
  Administered 2013-06-19: 3 [IU] via SUBCUTANEOUS
  Administered 2013-06-19 (×2): 4 [IU] via SUBCUTANEOUS
  Administered 2013-06-19: 3 [IU] via SUBCUTANEOUS
  Administered 2013-06-19: 4 [IU] via SUBCUTANEOUS
  Administered 2013-06-19: 11 [IU] via SUBCUTANEOUS
  Administered 2013-06-20: 3 [IU] via SUBCUTANEOUS
  Administered 2013-06-20: 4 [IU] via SUBCUTANEOUS
  Administered 2013-06-20 (×2): 3 [IU] via SUBCUTANEOUS
  Administered 2013-06-21: 4 [IU] via SUBCUTANEOUS
  Administered 2013-06-21: 14:00:00 via SUBCUTANEOUS
  Administered 2013-06-21 (×2): 3 [IU] via SUBCUTANEOUS
  Administered 2013-06-21: 4 [IU] via SUBCUTANEOUS
  Administered 2013-06-22 (×2): 3 [IU] via SUBCUTANEOUS
  Administered 2013-06-22: 7 [IU] via SUBCUTANEOUS
  Administered 2013-06-23: 4 [IU] via SUBCUTANEOUS

## 2013-06-11 MED ORDER — LEVOFLOXACIN 25 MG/ML PO SOLN
750.0000 mg | ORAL | Status: DC
  Administered 2013-06-11: 750 mg
  Filled 2013-06-11: qty 30

## 2013-06-11 MED ORDER — LEVOFLOXACIN 750 MG PO TABS
750.0000 mg | ORAL_TABLET | ORAL | Status: DC
  Filled 2013-06-11: qty 1

## 2013-06-11 MED ORDER — INSULIN GLARGINE 100 UNIT/ML ~~LOC~~ SOLN
9.0000 [IU] | SUBCUTANEOUS | Status: DC
  Administered 2013-06-11 – 2013-06-12 (×2): 9 [IU] via SUBCUTANEOUS
  Filled 2013-06-11 (×4): qty 0.09

## 2013-06-11 MED ORDER — LEVOFLOXACIN 750 MG PO TABS
750.0000 mg | ORAL_TABLET | Freq: Every day | ORAL | Status: DC
  Filled 2013-06-11: qty 1

## 2013-06-11 MED ORDER — SALINE SPRAY 0.65 % NA SOLN
1.0000 | NASAL | Status: DC | PRN
  Administered 2013-06-11: 1 via NASAL
  Filled 2013-06-11: qty 44

## 2013-06-11 MED ORDER — NUTREN 1.0/FIBER PO LIQD
ORAL | Status: DC
  Administered 2013-06-11 – 2013-06-16 (×3): via ORAL

## 2013-06-11 NOTE — Consult Note (Signed)
Referring Provider: No ref. provider found Primary Care Physician:  Norman Clay, MD Primary Gastroenterologist:  Gentry Fitz  Reason for Consultation:  Evaluate regarding previous Nissen fundoplication and PEG; having oxygen desats with feed boluses  HPI: Thomas Conley is a 28 y.o. male with MR, mitochondrial myopathy, type 1 diabetes mellitus, severe kyphoscoliosis, history of Nissen fundoplication, and PEG tube dependent.  He admitted via ED with severe respiratory distress likely due to aspiration PNA; is on antibiotics.  Is on antibiotics per critical care.  It has been noted that each time the patient receives his 250 cc tube feed bolus that his oxygen sats drop while he is here in the hospital.  His mother and father are very attentive to his care and stated that this was not happening at home; has not noticed any coughing after feeds at home.  Tube was not leaking at home.  He does not take any PO at home.  No nausea or vomiting.  GI being consulted regarding the best way to evaluate PEG, etc.  Mom says that he is very active and energetic at home.  Says that his scoliosis has gotten worse over the past several years.  He has gained some weight over the past several months.   Past Medical History  Diagnosis Date  . 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 Mitochondrial myopathy   . Mental retardation   . GERD (gastroesophageal reflux disease)   . Bronchiectasis   . Hypoglycemia associated with diabetes   . Osteopenia   . Fatigue   . Gastrostomy tube dependent   . Scoliosis     Past Surgical History  Procedure Laterality Date  . Gastrostomy tube placement    . Muscle biopsies    . Nissen fundoplication    . Pressure equalization tube    . Inguinal hernia repair    . Left tear duct      Prior to Admission medications   Medication Sig Start Date End Date Taking? Authorizing Provider  acetaminophen (TYLENOL) 160 MG/5ML solution Take 640 mg by  mouth every 6 (six) hours as needed.   Yes Historical Provider, MD  albuterol (PROVENTIL) (5 MG/ML) 0.5% nebulizer solution Take 2.5 mg by nebulization every 4 (four) hours as needed for wheezing or shortness of breath.   Yes Historical Provider, MD  budesonide (PULMICORT) 0.5 MG/2ML nebulizer solution Take 0.5 mg by nebulization 2 (two) times daily.   Yes Historical Provider, MD  divalproex (DEPAKOTE SPRINKLE) 125 MG capsule Place 250-325 mg into feeding tube 3 (three) times daily. 325mg  in the morning, 250mg  at lunch, 325mg  at dinner   Yes Historical Provider, MD  erythromycin ophthalmic ointment Place 1 application into the left eye as needed (Eye infection).   Yes Historical Provider, MD  ibuprofen (ADVIL,MOTRIN) 100 MG/5ML suspension Place 400 mg into feeding tube every 4 (four) hours as needed for fever or mild pain.    Yes Historical Provider, MD  insulin aspart (NOVOLOG) 100 UNIT/ML injection Inject 2-10 Units into the skin 5 (five) times daily. Via home sliding scale insulin   Yes Historical Provider, MD  insulin glargine (LANTUS) 100 UNIT/ML injection Inject 9-10 Units into the skin daily. *per sliding scale*   Yes Historical Provider, MD  nystatin (MYCOSTATIN) powder Apply topically as needed. Use around his G-tube and genitals when needed   Yes Historical Provider, MD  ofloxacin (FLOXIN) 0.3 % otic solution Place 5 drops into both ears daily.   Yes Historical Provider,  MD  tobramycin (TOBREX) 0.3 % ophthalmic ointment Place 1 application into the left eye as needed.   Yes Historical Provider, MD    Current Facility-Administered Medications  Medication Dose Route Frequency Provider Last Rate Last Dose  . 0.9 %  sodium chloride infusion  250 mL Intravenous PRN Merwyn Katos, MD      . budesonide (PULMICORT) nebulizer solution 0.25 mg  0.25 mg Nebulization 4 times per day Merwyn Katos, MD   0.25 mg at 06/11/13 0726  . dextrose 5 %-0.9 % sodium chloride infusion   Intravenous Continuous  Nelda Bucks, MD 20 mL/hr at 06/10/13 1500    . divalproex (DEPAKOTE SPRINKLE) capsule 250 mg  250 mg Oral Q1200 Oretha Milch, MD   250 mg at 06/11/13 1132  . divalproex (DEPAKOTE SPRINKLE) capsule 375 mg  375 mg Oral BID Oretha Milch, MD   375 mg at 06/11/13 0749  . heparin injection 5,000 Units  5,000 Units Subcutaneous 3 times per day Nelda Bucks, MD   5,000 Units at 06/11/13 0601  . ibuprofen (ADVIL,MOTRIN) 100 MG/5ML suspension 400 mg  400 mg Per Tube Q8H PRN Oretha Milch, MD   400 mg at 06/10/13 1842  . insulin aspart (novoLOG) injection 0-9 Units  0-9 Units Subcutaneous 6 times per day Merwyn Katos, MD   2 Units at 06/11/13 1133  . levalbuterol (XOPENEX) nebulizer solution 0.63 mg  0.63 mg Nebulization 4 times per day Merwyn Katos, MD   0.63 mg at 06/11/13 0726  . levalbuterol (XOPENEX) nebulizer solution 0.63 mg  0.63 mg Nebulization Q3H PRN Merwyn Katos, MD      . levofloxacin [facility]) tablet 750 mg  750 mg Per Tube Daily Judie Bonus Hammons, RPH      . metoprolol (LOPRESSOR) injection 2.5-5 mg  2.5-5 mg Intravenous Q3H PRN Oretha Milch, MD   5 mg at 06/09/13 1919  . NUTREN 1.0/FIBER LIQD   Oral Continuous Nelda Bucks, MD      . nystatin (MYCOSTATIN/NYSTOP) topical powder   Topical PRN Nelda Bucks, MD      . ondansetron [facility]) injection 4 mg  4 mg Intravenous Q6H PRN Lonia Farber, MD   4 mg at 06/10/13 2000  . pantoprazole (PROTONIX) injection 40 mg  40 mg Intravenous Q24H Nelda Bucks, MD   40 mg at 06/11/13 1133  . sodium chloride 0.9 % injection 20 mL  20 mL Intravenous PRN Merwyn Katos, MD        Allergies as of 06-20-2013 - Review Complete 06-20-13  Allergen Reaction Noted  . Other Anaphylaxis 2013-06-20  . Augmentin [amoxicillin-pot clavulanate]  08/31/2010  . Cefuroxime axetil  08/31/2010  . Cephalosporins  08/31/2010  . Klonopin [clonazepam]  08/31/2010  . Morphine and related  12/03/2010  . Sulfa  antibiotics  08/31/2010  . Tegretol [carbamazepine]  08/31/2010    Family History  Problem Relation Age of Onset  . Cancer Neg Hx   . Diabetes Neg Hx   . Thyroid disease Neg Hx     History   Social History  . Marital Status: Single    Spouse Name: N/A    Number of Children: N/A  . Years of Education: N/A   Occupational History  . Not on file.   Social History Main Topics  . Smoking status: Never Smoker   . Smokeless tobacco: Not on file  . Alcohol Use: No  . Drug Use:  No  . Sexual Activity:    Other Topics Concern  . Not on file   Social History Narrative  . No narrative on file    Review of Systems: Ten point ROS is O/W negative except as mentioned in HPI.  Physical Exam: Vital signs in last 24 hours: Temp:  [97.5 F (36.4 C)-98.9 F (37.2 C)] 98.9 F (37.2 C) (03/20 1249) Pulse Rate:  [88-142] 95 (03/20 1000) Resp:  [19-35] 23 (03/20 1000) BP: (106-152)/(55-88) 117/63 mmHg (03/20 1000) SpO2:  [92 %-100 %] 95 % (03/20 1000) Last BM Date: 06/10/13 General:  Alert, severely kyphoscoliotic. Head:  Normocephalic and atraumatic. Eyes:  Sclera clear, no icterus.  Conjunctiva pink. Ears:  Normal auditory acuity. Mouth:  No deformity or lesions.   Lungs:  Course lung sounds noted. Heart:  Regular rate and rhythm; no murmurs, clicks, rubs,  or gallops. Abdomen:  Soft, non-distended.  BS present.  Non-tender.  PEG site noted; clean and dry.   Rectal:  Deferred  Msk:  Diffuse muscle atrophy. Pulses:  Normal pulses noted. Extremities:  Some edema noted in his feet.  Contractures noted. Skin:  Intact without significant lesions or rashes.  Intake/Output from previous day: 03/19 0701 - 03/20 0700 In: 845 [I.V.:465; IV Piggyback:300] Out: -  Intake/Output this shift: Total I/O In: 60 [I.V.:60] Out: -   Lab Results:  Recent Labs  06/09/13 0220 06/10/13 0237 06/11/13 0250  WBC 6.7 6.7 6.3  HGB 13.6 12.2* 12.2*  HCT 41.6 38.4* 38.6*  PLT 73* 57* 51*    BMET  Recent Labs  06/09/13 0220 06/10/13 0237 06/10/13 1220  NA 138 136* 137  K 4.3 4.7 4.6  CL 98 93* 95*  CO2 31 37* 35*  GLUCOSE 116* 170* 149*  BUN 9 14 13   CREATININE 0.39* 0.39* 0.33*  CALCIUM 8.7 9.1 9.1   LFT  Recent Labs  06/10/13 0237  PROT 5.2*  ALBUMIN 2.5*  AST 60*  ALT 37  ALKPHOS 116  BILITOT 0.3   Studies/Results: Dg Chest Port 1 View  06/11/2013   CLINICAL DATA:  Evaluate right basilar infiltrate  EXAM: PORTABLE CHEST - 1 VIEW  COMPARISON:  DG CHEST 1V PORT dated 06/10/2013; DG CHEST 1V PORT dated 06/09/2013; DG CHEST 1V PORT dated 06/06/2013; DG CHEST 2 VIEW dated 12/20/2011  FINDINGS: Examination again remains markedly degraded secondary to scoliotic curvature, decreased lung volumes and patient rotation. Evaluation of the cardiac silhouette and mediastinal contours again degraded secondary to obliquity. Grossly unchanged bibasilar heterogeneous consolidative opacities. Trace left-sided effusion is not excluded. No definite pneumothorax. Grossly unchanged bones.  IMPRESSION: Degraded examination with similar findings of bibasilar heterogeneous/consolidative opacities, atelectasis versus infiltrate/aspiration. Further evaluation with a PA and lateral chest radiograph may be obtained as clinically indicated.   Electronically Signed   By: Simonne Come M.D.   On: 06/11/2013 07:09   Dg Chest Port 1 View  06/10/2013   CLINICAL DATA:  Cough and shortness of breath, possible aspiration episode  EXAM: PORTABLE CHEST - 1 VIEW  COMPARISON:  DG CHEST 1V PORT dated 06/09/2013  FINDINGS: The contour of the thorax is markedly distorted due to severe a dextroscoliosis of the mid thoracic spine. The visualized portions of the lung appear adequately aerated. The left lung base and left hemidiaphragm are slightly less dense today. The pulmonary interstitial markings remain mildly increased. There is density on the right at the costophrenic gutter which may reflect atelectasis that  appears new since yesterday's study.  IMPRESSION:  Increased density at the right lung base may reflect the clinically suspected aspiration. Otherwise the examination is unchanged.   Electronically Signed   By: David  Swazil[place]   On: 06/10/2013 07:17   Dg Chest Port 1 View  06/09/2013   CLINICAL DATA:  Followup pneumonia.  EXAM: PORTABLE CHEST - 1 VIEW  COMPARISON:  05/28/2013  FINDINGS: Study is severely limited due to the severe thoracolumbar scoliosis. It is difficult to exclude a left basilar opacity as there appears to be increasing obscuration of the left hemidiaphragm. No definite focal opacity on the right. Heart is normal size.  IMPRESSION: Severely limited exam due to the severe thoracolumbar scoliosis. Questionable increasing left basilar infiltrate.   Electronically Signed   By: Charlett NoseKevin  Dover M.D.   On: 06/09/2013 18:47    IMPRESSION:  -[age] year old male with MR, mitochondrial myopathy, type 1 diabetes mellitus, severe kyphoscoliosis, history of Nissen fundoplication, and PEG tube dependent.  Hospitalized for aspiration PNA.  Now having desaturations with 250 cc PEG tube feeds.  Rule out dislodged PEG vs slipped Nissen vs TE fistula vs gastroparesis (due to diabetes).    PLAN: -Discussed with Dr. Rhea BeltonPyrtle and radiology.  Will order UGI via PEG (will likely perform with gastrograffin first). -Antibiotics per critical care.   ZEHR, JESSICA D.  06/11/2013, 12:53 PM  Pager number [phone]

## 2013-06-11 NOTE — Consult Note (Signed)
Patient seen, examined, and I agree with the above documentation, including the assessment and plan. Patient with cerebral palsy, mitochondrial myopathy, diabetes, history of Nissen fundoplication and PEG tube admitted with PNA.  Source/pathogen for PNA unclear Question of aspiration and dysfunction of previously performed Nissen fundoplication Agree with upper GI series via feeding tube to evaluate for normal gastric emptying and also to rule out free reflux Discussed with patient's mother and father at bedside today Await tube study

## 2013-06-11 NOTE — Progress Notes (Signed)
NUTRITION FOLLOW UP  Intervention:    Resume TF with continuous regimen of Nutren 1.0 (pour 1,000 ml of Nutren 1.0 with fiber and 250 ml of Nutren 1.0 without fiber into feeding bag) run at 52 ml/h x 24 hours per day to provide 1250 kcals, 50 gm protein, 1050 ml free water daily.  Parents to supply Nutren 1.0 formula and RN to pour into a bag for feeding.  Nutrition Dx:   Inadequate oral intake related to chronic swallowing difficulty as evidenced by PEG in place for nutrition and NPO status. Ongoing.  Goal:   Intake to meet >90% of estimated nutrition needs. Unmet.  Monitor:   TF tolerance/adequacy, weight trend, labs.  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 He also has severe kyphoscoliosis and severe hearing impairment. He is cared for at home by his parents. He is followed by [facility]ian, Dr Corinna Capra. He presented to [facility] ED on day of admission with a 3-4 day history of fever, cough and progressive dyspnea to the point of respiratory distress on the morning of admission. Patient has a G tube for nutrition.  Patient had some abdominal distention and pain with tube feeding on 3/18, so TF has been on hold. Discussed patient in ICU rounds today. Met with patient and his father. Plans to resume TF today on a continuous regimen. Patient's dad prefers to use home formula for feedings via PEG.  Height: Ht Readings from Last 1 Encounters:  06/22/2013 5' (1.524 m)    Weight Status:   Wt Readings from Last 1 Encounters:  06/16/2013 101 lb 10.1 oz (46.1 kg)    Re-estimated needs:  Kcal: 1250-1500  Protein: 50-60 gm  Fluid: 1.2-1.5 L  Skin: no wounds  Diet Order:  NPO   Intake/Output Summary (Last 24 hours) at 06/11/13 1205 Last data filed at 06/11/13 1000  Gross per 24 hour  Intake    805 ml  Output      0 ml  Net    805 ml    Last BM: 3/19   Labs:   Recent Labs Lab 06/09/13 0220  06/10/13 0237 06/10/13 1220  NA 138 136* 137  K 4.3 4.7 4.6  CL 98 93* 95*  CO2 31 37* 35*  BUN _0 CREATININE 0.39* 0.39* 0.33*  CALCIUM 8.7 9.1 9.1  GLUCOSE 116* 170* 149*    CBG (last 3)   Recent Labs  06/10/13 2356 06/11/13 0353 06/11/13 0744  GLUCAP 150* 84 97    Scheduled Meds: . budesonide  0.25 mg Nebulization 4 times per day  . divalproex  250 mg Oral Q1200  . divalproex  375 mg Oral BID  . heparin subcutaneous  5,000 Units Subcutaneous 3 times per day  . insulin aspart  0-9 Units Subcutaneous 6 times per day  . levalbuterol  0.63 mg Nebulization 4 times per day  . levofloxacin  750 mg Per Tube Daily  . NUTREN 1.0/FIBER  100 mL Oral 3 times per day  . pantoprazole (PROTONIX) IV  40 mg Intravenous Q24H    Continuous Infusions: . dextrose 5 % and 0.9% NaCl 20 mL/hr at 06/10/13 [place], RD, LDN, [place] Pager [phone] After Hours Pager [phone]

## 2013-06-11 NOTE — Progress Notes (Signed)
PULMONARY / CRITICAL CARE MEDICINE   Name: Thomas Conley MRN: [id number] DOB: [birth year]    ADMISSION DATE:  05/26/2013  REFERRING MD :  EDP PRIMARY SERVICE: PCCM  BRIEF PATIENT DESCRIPTION:  [age] M with cerebral palsy and severe kyphoscoliosis admitted via ED with severe respiratory distress likely due to PNA  SIGNIFICANT EVENTS / STUDIES:  3/20- slow improvement  LINES / TUBES:  CULTURES: Blood 3/17 >>  Viral 3/18>>> Urine strep 3/18>>>p  ANTIBIOTICS: Vanc 3/17 >>>3/19 Levofloxacin 3/17 >>>plan stop date 3/24 Aztreonam 3/18>>>3/20  SUBJECTIVE: Improved resp status  VITAL SIGNS: Temp:  [97.5 F (36.4 C)-98.6 F (37 C)] 97.8 F (36.6 C) (03/20 0837) Pulse Rate:  [88-142] 95 (03/20 1000) Resp:  [19-35] 23 (03/20 1000) BP: (106-152)/(55-88) 117/63 mmHg (03/20 1000) SpO2:  [92 %-100 %] 95 % (03/20 1000) HEMODYNAMICS:   VENTILATOR SETTINGS:   INTAKE / OUTPUT: Intake/Output     03/19 0701 - 03/20 0700 03/20 0701 - 03/21 0700   I.V. (mL/kg) 465 (10.1) 60 (1.3)   Other 80    NG/GT     IV Piggyback 300    Total Intake(mL/kg) 845 (18.3) 60 (1.3)   Net +845 +60        Urine Occurrence 4 x      PHYSICAL EXAMINATION: General: Severely kyphoscoliotic. RASS +1 Neuro: CNs intact, diffuse muscle atrophy HEENT: NCAT, poor dentition Neck: JVP wnl Cardiovascular: improved tachy mild s1 s 2 Lungs: coarse but improved apical Abdomen: G tube, soft, diminished BS Ext: contractures, muscle atrophy, symmetric brawny pedal edema Skin:  No lesions noted  LABS:  CBC  Recent Labs Lab 06/09/13 0220 06/10/13 0237 06/11/13 0250  WBC 6.7 6.7 6.3  HGB 13.6 12.2* 12.2*  HCT 41.6 38.4* 38.6*  PLT 73* 57* 51*   Coag's No results found for this basename: APTT, INR,  in the last 168 hours BMET  Recent Labs Lab 06/09/13 0220 06/10/13 0237 06/10/13 1220  NA 138 136* 137  K 4.3 4.7 4.6  CL 98 93* 95*  CO2 31 37* 35*  BUN 9 14 13   CREATININE 0.39* 0.39* 0.33*   GLUCOSE 116* 170* 149*   Electrolytes  Recent Labs Lab 06/09/13 0220 06/10/13 0237 06/10/13 1220  CALCIUM 8.7 9.1 9.1   Sepsis Markers  Recent Labs Lab 06/14/2013 1552  LATICACIDVEN 2.27*   ABG  Recent Labs Lab 06/11/2013 1533 06/10/13 0012  PHART 7.348* 7.331*  PCO2ART 60.0* 76.7*  PO2ART 49.0* 85.0   Liver Enzymes  Recent Labs Lab 05/26/2013 1452 06/10/13 0237  AST 59* 60*  ALT 43 37  ALKPHOS 160* 116  BILITOT 0.5 0.3  ALBUMIN 3.2* 2.5*   Cardiac Enzymes No results found for this basename: TROPONINI, PROBNP,  in the last 168 hours Glucose  Recent Labs Lab 06/10/13 1229 06/10/13 1546 06/10/13 1839 06/10/13 2356 06/11/13 0353 06/11/13 0744  GLUCAP 148* 166* 133* 150* 84 97     CXR: Difficult to interpret due to severe KS  ASSESSMENT / PLAN:  PULMONARY A: Acute resp failure likely due to PNA, r/o viral, baseline restriction from K-S, r/o aspiration P:   Bolus TF seem to increase RR and distress, need to work up aspiration s/p nissan dysfxn? Clinically improved overall No further PCXR Reduce O2 as able Even balance  CARDIOVASCULAR A: Sinus tachycardia - reactive (fever, agitation), restirciton resp at baseline Hypertension - reactive (agitation) P:  Treat underlying cause- agitations has really helped Tele monitoring Metoprolol prn Treat resp failure D/w Dr Rana SnareLowe,  primary, she will fax last echo - ASD small reported, may need back office for this   RENAL A: normal renal fx, reminder , low muscle mass P:   Maintenance IVFs to kvo limit phlebotomy Vit D level sent , requested by Dr Rana Snare  GASTROINTESTINAL A: Chronic dysphagia H/O Nissen fundoplication, function? Chronic G tube P:   Dc bolus TF Would prefer to start continuous feeds for now Consult nutrition appreciated Asp precautions ppi Will discuss with family possible gi / ccs evalution  HEMATOLOGIC A:  Thrombocytopenia - ? Chronicity, med related? Dilution now? P:  Sub q  hep Limit blood draws  INFECTIOUS A:  Severe sepsis Presumed PNA R/o viral P:   Narrow to levo, add total 8 days abx  ENDOCRINE A:  DM1, glu to 70 P:   Restart lantus if T fstarted  NEUROLOGIC A: Cerebral palsy Muscle spasms Cognitive impairment Hearing impairment Agitation ("doesn't like doctors") P:   Cont VPA (usual dose is 375, 250, 375 mg) - simplified to 333 TID for now Minimize anxiety inducing stimuli - this has helped Parents updated extensive daily  TODAY'S SUMMARY: to sdu, cont tf, narrow abx  I have personally obtained a history, examined the patient, evaluated laboratory and imaging results, formulated the assessment and plan and placed orders. CRITICAL CARE:  Medardo Hassing. Tyson Alias, MD, FACP Pgr: [phone] [facility]  Pulmonary and Critical Care Medicine [facility] Pager: [phone]  06/11/2013, 11:42 AM

## 2013-06-12 ENCOUNTER — Inpatient Hospital Stay (HOSPITAL_COMMUNITY): Payer: Managed Care, Other (non HMO)

## 2013-06-12 ENCOUNTER — Encounter (HOSPITAL_COMMUNITY): Payer: Self-pay | Admitting: *Deleted

## 2013-06-12 DIAGNOSIS — I319 Disease of pericardium, unspecified: Secondary | ICD-10-CM

## 2013-06-12 DIAGNOSIS — J122 Parainfluenza virus pneumonia: Secondary | ICD-10-CM | POA: Diagnosis present

## 2013-06-12 LAB — GLUCOSE, CAPILLARY
GLUCOSE-CAPILLARY: 220 mg/dL — AB (ref 70–99)
GLUCOSE-CAPILLARY: 238 mg/dL — AB (ref 70–99)
Glucose-Capillary: 146 mg/dL — ABNORMAL HIGH (ref 70–99)
Glucose-Capillary: 174 mg/dL — ABNORMAL HIGH (ref 70–99)
Glucose-Capillary: 193 mg/dL — ABNORMAL HIGH (ref 70–99)
Glucose-Capillary: 263 mg/dL — ABNORMAL HIGH (ref 70–99)

## 2013-06-12 LAB — POCT I-STAT 3, ART BLOOD GAS (G3+)
ACID-BASE EXCESS: 23 mmol/L — AB (ref 0.0–2.0)
Bicarbonate: 52.6 mEq/L — ABNORMAL HIGH (ref 20.0–24.0)
O2 SAT: 99 %
PCO2 ART: 83 mmHg — AB (ref 35.0–45.0)
Patient temperature: 98.3
pH, Arterial: 7.409 (ref 7.350–7.450)
pO2, Arterial: 170 mmHg — ABNORMAL HIGH (ref 80.0–100.0)

## 2013-06-12 LAB — BLOOD GAS, ARTERIAL
Acid-Base Excess: 22.8 mmol/L — ABNORMAL HIGH (ref 0.0–2.0)
Bicarbonate: 48.6 mEq/L — ABNORMAL HIGH (ref 20.0–24.0)
DRAWN BY: 330991
FIO2: 100 %
Mode: POSITIVE
O2 SAT: 99.4 %
PEEP/CPAP: 6 cmH2O
PH ART: 7.467 — AB (ref 7.350–7.450)
PO2 ART: 179 mmHg — AB (ref 80.0–100.0)
PRESSURE CONTROL: 12 cmH2O
Patient temperature: 99.3
RATE: 15 resp/min
TCO2: 50.6 mmol/L (ref 0–100)
pCO2 arterial: 68.4 mmHg (ref 35.0–45.0)

## 2013-06-12 MED ORDER — LEVALBUTEROL HCL 0.63 MG/3ML IN NEBU
0.6300 mg | INHALATION_SOLUTION | RESPIRATORY_TRACT | Status: DC
  Administered 2013-06-12 – 2013-06-13 (×4): 0.63 mg via RESPIRATORY_TRACT
  Filled 2013-06-12 (×8): qty 3

## 2013-06-12 MED ORDER — LORAZEPAM 2 MG/ML IJ SOLN
0.5000 mg | Freq: Three times a day (TID) | INTRAMUSCULAR | Status: DC | PRN
  Administered 2013-06-12: 0.5 mg via INTRAVENOUS
  Administered 2013-06-13: 1 mg via INTRAVENOUS
  Filled 2013-06-12 (×2): qty 1

## 2013-06-12 MED ORDER — LORAZEPAM 2 MG/ML IJ SOLN
INTRAMUSCULAR | Status: AC
  Administered 2013-06-12: 0.5 mg via INTRAVENOUS
  Filled 2013-06-12: qty 1

## 2013-06-12 MED ORDER — BIOTENE DRY MOUTH MT LIQD
15.0000 mL | Freq: Two times a day (BID) | OROMUCOSAL | Status: DC
  Administered 2013-06-12 – 2013-06-13 (×4): 15 mL via OROMUCOSAL

## 2013-06-12 MED ORDER — LORAZEPAM BOLUS VIA INFUSION: 0.5000 mg | Freq: Three times a day (TID) | INTRAVENOUS | Status: DC | PRN

## 2013-06-12 MED ORDER — VANCOMYCIN HCL IN DEXTROSE 750-5 MG/150ML-% IV SOLN
750.0000 mg | Freq: Three times a day (TID) | INTRAVENOUS | Status: DC
  Administered 2013-06-12 – 2013-06-16 (×12): 750 mg via INTRAVENOUS
  Filled 2013-06-12 (×14): qty 150

## 2013-06-12 MED ORDER — ACETYLCYSTEINE 20 % IN SOLN
4.0000 mL | Freq: Four times a day (QID) | RESPIRATORY_TRACT | Status: DC
  Administered 2013-06-12 – 2013-06-13 (×4): 4 mL via RESPIRATORY_TRACT
  Filled 2013-06-12 (×6): qty 4

## 2013-06-12 MED ORDER — CHLORHEXIDINE GLUCONATE 0.12 % MT SOLN
15.0000 mL | Freq: Two times a day (BID) | OROMUCOSAL | Status: DC
Start: 2013-06-12 — End: 2013-06-24
  Administered 2013-06-12 – 2013-06-24 (×25): 15 mL via OROMUCOSAL
  Filled 2013-06-12 (×23): qty 15

## 2013-06-12 MED ORDER — ACETYLCYSTEINE 10 % IN SOLN
4.0000 mL | Freq: Four times a day (QID) | RESPIRATORY_TRACT | Status: DC
  Filled 2013-06-12 (×3): qty 4

## 2013-06-12 MED ORDER — DEXTROSE 5 % IV SOLN
1.0000 g | Freq: Three times a day (TID) | INTRAVENOUS | Status: DC
  Administered 2013-06-12 – 2013-06-16 (×13): 1 g via INTRAVENOUS
  Filled 2013-06-12 (×16): qty 1

## 2013-06-12 NOTE — Progress Notes (Signed)
eLink Physician-Brief Progress Note Patient Name: Thomas FermoDaniel F Conley DOB: [birth year] MRN: [id number]  Date of Service  06/12/2013   HPI/Events of Note   ABG revealed chronic compensated resp acidosis.  Family, nurse, resp therapy feel restlessness and agitation an issue.  eICU Interventions  Trial of low dose benzodiazepine.      Thomas Conley, Thomas Conley, P 06/12/2013, 10:18 PM

## 2013-06-12 NOTE — Progress Notes (Signed)
Pateints respiratory status has deteriorated and he has returned to the ICU on bipap. Dr. Delford FieldWright appropriately cancelled UGI series. On continuous TFs for now. We will check back on Monday.

## 2013-06-12 NOTE — Progress Notes (Signed)
PT still agitated, parents requested ativan dosage, given, see MAR.

## 2013-06-12 NOTE — Progress Notes (Signed)
eLink Physician-Brief Progress Note Patient Name: Thomas FermoDaniel F Stadel DOB: Dec 25, 1985 MRN: [id number]  Date of Service  06/12/2013   HPI/Events of Note   Called by nurse for increased work of breathing and ronchorus BS.  Patient on Bipap.  Oxygenation stable.  Elevated HR. Forceful exhalation noted.  Family mentioned concern for seizure b/c he appeared "jittery".  At this time seizure activity not obvious but clearly has increased work of breathing.  eICU Interventions  Continue bipap xopenex treatment now cxr abg   Intervention Category Intermediate Interventions: Respiratory distress - evaluation and management  Henry RusselSMITH, Camila [place], P 06/12/2013, 9:50 PM

## 2013-06-12 NOTE — Progress Notes (Signed)
ANTIBIOTIC CONSULT NOTE - FOLLOW UP  Pharmacy Consult for Vancomycin and Aztreonam Indication: pneumonia  Allergies  Allergen Reactions  . Other Anaphylaxis    *all narcotics*  . Augmentin [Amoxicillin-Pot Clavulanate]   . Cefuroxime Axetil   . Cephalosporins   . Klonopin [Clonazepam]   . Morphine And Related   . Sulfa Antibiotics   . Tegretol [Carbamazepine]     Patient Measurements: Height: 5' (152.4 cm) Weight: 96 lb 12.5 oz (43.9 kg) IBW/kg (Calculated) : 50  Vital Signs: Temp: 98.8 F (37.1 C) (03/21 0400) Temp src: Axillary (03/21 0400) BP: 118/62 mmHg (03/21 0400) Pulse Rate: 123 (03/21 0400) Intake/Output from previous day: 03/20 0701 - 03/21 0700 In: 960 [I.V.:180; NG/GT:156] Out: 75 [Urine:75] Intake/Output from this shift:    Labs:  Recent Labs  06/10/13 0237 06/10/13 1220 06/11/13 0250  WBC 6.7  --  6.3  HGB 12.2*  --  12.2*  PLT 57*  --  51*  CREATININE 0.39* 0.33*  --    Estimated Creatinine Clearance: 86.1 ml/min (by C-G formula based on Cr of 0.33).  Recent Labs  06/09/13 1538  VANCOTROUGH <5.0*     Microbiology: Recent Results (from the past 720 hour(s))  CULTURE, BLOOD (ROUTINE X 2)     Status: None   Collection Time    06/11/2013  3:10 PM      Result Value Ref Range Status   Specimen Description BLOOD ARM RIGHT   Final   Special Requests BOTTLES DRAWN AEROBIC AND ANAEROBIC 5CC   Final   Culture  Setup Time     Final   Value: 05/26/2013 22:38     Performed at [facility]   Culture     Final   Value:        BLOOD CULTURE RECEIVED NO GROWTH TO DATE CULTURE WILL BE HELD FOR 5 DAYS BEFORE ISSUING A FINAL NEGATIVE REPORT     Performed at [facility]   Report Status PENDING   Incomplete  CULTURE, BLOOD (ROUTINE X 2)     Status: None   Collection Time    05/28/2013  3:52 PM      Result Value Ref Range Status   Specimen Description BLOOD HAND RIGHT   Final   Special Requests BOTTLES DRAWN AEROBIC ONLY 5.5CC   Final   Culture  Setup Time     Final   Value: 06/05/2013 18:53     Performed at [facility]   Culture     Final   Value:        BLOOD CULTURE RECEIVED NO GROWTH TO DATE CULTURE WILL BE HELD FOR 5 DAYS BEFORE ISSUING A FINAL NEGATIVE REPORT     Performed at [facility]   Report Status PENDING   Incomplete  MRSA PCR SCREENING     Status: None   Collection Time    06/09/2013  7:28 PM      Result Value Ref Range Status   MRSA by PCR NEGATIVE  NEGATIVE Final   Comment:            The GeneXpert MRSA Assay (FDA     approved for NASAL specimens     only), is one component of a     comprehensive MRSA colonization     surveillance program. It is not     intended to diagnose MRSA     infection nor to guide or     monitor treatment for  MRSA infections.  RESPIRATORY VIRUS PANEL     Status: Abnormal   Collection Time    06/10/13  4:00 PM      Result Value Ref Range Status   Source - RVPAN NASAL SWAB   Corrected   Comment: CORRECTED ON 03/20 AT 2230: PREVIOUSLY REPORTED AS NASAL SWAB   Respiratory Syncytial Virus A NOT DETECTED   Final   Respiratory Syncytial Virus B NOT DETECTED   Final   Influenza A NOT DETECTED   Final   Influenza B NOT DETECTED   Final   Parainfluenza 1 NOT DETECTED   Final   Parainfluenza 2 NOT DETECTED   Final   Parainfluenza 3 DETECTED (*)  Final   Metapneumovirus NOT DETECTED   Final   Rhinovirus NOT DETECTED   Final   Adenovirus NOT DETECTED   Final   Influenza A H1 NOT DETECTED   Final   Influenza A H3 NOT DETECTED   Final   Comment: (NOTE)           Normal Reference Range for each Analyte: NOT DETECTED     Testing performed using the Luminex xTAG Respiratory Viral Panel test     kit.     This test was developed and its performance characteristics determined     by [facility]. It has not been cleared or approved by the [place]     Food and Drug Administration. This test is used for clinical purposes.     It should not be regarded as  investigational or for research. This     laboratory is certified under the [place] (CLIA) as qualified to perform high complexity     clinical laboratory testing.     Performed at [place]   Start     Dose/Rate Route Frequency Ordered Stop   06/12/13 1500  levofloxacin (LEVAQUIN) tablet 750 mg  Status:  Discontinued     750 mg Per Tube Every 24 hours 06/11/13 1535 06/12/13 0748   06/11/13 1600  levofloxacin (LEVAQUIN) 25 MG/ML solution 750 mg  Status:  Discontinued     750 mg Per Tube Every 24 hours 06/11/13 1357 06/11/13 1535   06/11/13 1300  levofloxacin (LEVAQUIN) tablet 750 mg  Status:  Discontinued     750 mg Oral Daily 06/11/13 1148 06/11/13 1155   06/11/13 1300  levofloxacin (LEVAQUIN) tablet 750 mg  Status:  Discontinued     750 mg Per Tube Daily 06/11/13 1155 06/11/13 1356   06/10/13 0200  vancomycin (VANCOCIN) IVPB 750 mg/150 ml premix  Status:  Discontinued     750 mg 150 mL/hr over 60 Minutes Intravenous Every 8 hours 06/09/13 1743 06/10/13 1006   06/09/13 1745  vancomycin (VANCOCIN) IVPB 750 mg/150 ml premix     750 mg 150 mL/hr over 60 Minutes Intravenous NOW 06/09/13 1743 06/09/13 1945   06/09/13 1600  levofloxacin (LEVAQUIN) IVPB 750 mg  Status:  Discontinued     750 mg 100 mL/hr over 90 Minutes Intravenous Every 24 hours 06/12/2013 1700 06/11/13 1147   06/09/13 1500  aztreonam (AZACTAM) 1 g in dextrose 5 % 50 mL IVPB  Status:  Discontinued     1 g 100 mL/hr over 30 Minutes Intravenous 3 times per day 06/09/13 1438 06/11/13 1145   06/09/13 0100  vancomycin (VANCOCIN) 500 mg in sodium chloride 0.9 % 100 mL IVPB  Status:  Discontinued  500 mg 100 mL/hr over 60 Minutes Intravenous Every 8 hours 06/16/2013 1700 06/09/13 1743   05/25/2013 1530  levofloxacin (LEVAQUIN) IVPB 750 mg     750 mg 100 mL/hr over 90 Minutes Intravenous  Once 06/22/2013 1515 06/22/2013 1716   06/20/2013 1530  vancomycin  (VANCOCIN) IVPB 1000 mg/200 mL premix     1,000 mg 200 mL/hr over 60 Minutes Intravenous  Once 05/31/2013 1515 06/10/2013 1816      Assessment: 28yo male with cerebral palsy and severe kyphoscoliosis who has worse respiratory status and more secretions this morning. CCM requested to broaden antibiotic spectrum back to vancomycin and aztreonam (was on levofloxacin for the past day). SCr is 0.33, however this is falsely low d/t his low muscle mass and CrCl is difficult to determine. No growth on blood cultures, parainfluenza positive.  Goal of Therapy:  Vancomycin trough level 15-20 mcg/ml  Plan:  1. Restart vancomycin to 79m IV q8h 2. Restart aztreonam 1g IV q8h 3. Follow for changes in SCr which would indicate changes to renal function 4. Trough before 5th dose (ordered for 3/22 at 1630) 5. Follow clinical progression, LOT, c/s  Sheryll Dymek D. Jemeka Wagler, PharmD, BCPS Clinical Pharmacist Pager: [phone]/21/2015 8:03 AM

## 2013-06-12 NOTE — Progress Notes (Signed)
  Echocardiogram 2D Echocardiogram has been performed.  Thomas Conley 06/12/2013, 5:23 PM

## 2013-06-12 NOTE — Progress Notes (Signed)
Pt NT suctioned. Passed down left nare with ease with small amount of thick tan and blood returned, passed down right nare with difficulty only passing halfway for scant amount of white and blood. PT tolerated this well and back to Bipap with SATS back to 99%. RN in room.

## 2013-06-12 NOTE — Progress Notes (Signed)
Pt noted to have decreased O2 sats aroung 0630 this AM. Went to room and pt was having difficulty breathing and his Sats were in the 60's. Tried to reposition pt and reapply air mask that he had taken off. The patient did not recover and the decision was made to start bagging the pt to reestablish proper O2 sats. RT was notified and they came over to see the pt. The pt was NTS X 2 with return of thick secretions. Pt did recover into the 80's with his Sats. Dr. Delford FieldWright was notified at this time. Decision was made by him to start BiPap and transfer to ICU for a higher level of care. Dad in room with pt at time of this happening and he was aware of everything going on. Pt transferred on BiPap without difficulty and report was given.

## 2013-06-12 NOTE — Progress Notes (Signed)
Pt agitated, spk with Dr Katrinka BlazingSmith, order ABG, CXR and 0.5mg  of Ativan.  Pt calmed down, both mother and father refuse ativan at this time as pt has calmed down.

## 2013-06-12 NOTE — Progress Notes (Addendum)
PULMONARY / CRITICAL CARE MEDICINE   Name: Thomas Conley MRN: [id number] DOB: 06-03-1985    ADMISSION DATE:  05/26/2013  REFERRING MD :  EDP PRIMARY SERVICE: PCCM  BRIEF PATIENT DESCRIPTION:  3 M with cerebral palsy and severe kyphoscoliosis admitted via ED with severe respiratory distress likely due to PNA  SIGNIFICANT EVENTS / STUDIES:  3/20- slow improvement  LINES / TUBES:  CULTURES: Blood 3/17 >>  Viral 3/18>>>parainfluenza  Urine strep 3/18>>>p  ANTIBIOTICS: Vanc 3/17 >>>3/19 Levofloxacin 3/17 >>>plan stop date 3/24 Aztreonam 3/18>>>3/20  SUBJECTIVE:  Much worse, more distress, secretions are excess and diff to raise  VITAL SIGNS: Temp:  [97.8 F (36.6 C)-98.9 F (37.2 C)] 98.8 F (37.1 C) (03/21 0400) Pulse Rate:  [84-135] 123 (03/21 0400) Resp:  [19-33] 27 (03/21 0400) BP: (117-142)/(50-79) 118/62 mmHg (03/21 0400) SpO2:  [87 %-100 %] 96 % (03/21 0400) FiO2 (%):  [100 %] 100 % (03/21 0400) HEMODYNAMICS: tachycardia   VENTILATOR SETTINGS: Vent Mode:  [-]  FiO2 (%):  [100 %] 100 % INTAKE / OUTPUT: Intake/Output     03/20 0701 - 03/21 0700 03/21 0701 - 03/22 0700   I.V. (mL/kg) 180 (3.9)    Other 624    NG/GT 156    IV Piggyback     Total Intake(mL/kg) 960 (20.8)    Urine (mL/kg/hr) 75 (0.1)    Total Output 75     Net +885          Urine Occurrence 1 x      PHYSICAL EXAMINATION: General: Severely kyphoscoliotic. RASS +1 Neuro: CNs intact, diffuse muscle atrophy HEENT: NCAT, poor dentition Neck: JVP wnl Cardiovascular: improved tachy mild s1 s 2 Lungs: rhonchi, rales Abdomen: G tube, soft, diminished BS Ext: contractures, muscle atrophy, symmetric brawny pedal edema Skin:  No lesions noted  LABS:  CBC  Recent Labs Lab 06/09/13 0220 06/10/13 0237 06/11/13 0250  WBC 6.7 6.7 6.3  HGB 13.6 12.2* 12.2*  HCT 41.6 38.4* 38.6*  PLT 73* 57* 51*   Coag's No results found for this basename: APTT, INR,  in the last 168  hours BMET  Recent Labs Lab 06/09/13 0220 06/10/13 0237 06/10/13 1220  NA 138 136* 137  K 4.3 4.7 4.6  CL 98 93* 95*  CO2 31 37* 35*  BUN 9 14 13   CREATININE 0.39* 0.39* 0.33*  GLUCOSE 116* 170* 149*   Electrolytes  Recent Labs Lab 06/09/13 0220 06/10/13 0237 06/10/13 1220  CALCIUM 8.7 9.1 9.1   Sepsis Markers  Recent Labs Lab 06/05/2013 1552  LATICACIDVEN 2.27*   ABG  Recent Labs Lab 06/20/2013 1533 06/10/13 0012  PHART 7.348* 7.331*  PCO2ART 60.0* 76.7*  PO2ART 49.0* 85.0   Liver Enzymes  Recent Labs Lab 06/07/2013 1452 06/10/13 0237  AST 59* 60*  ALT 43 37  ALKPHOS 160* 116  BILITOT 0.5 0.3  ALBUMIN 3.2* 2.5*   Cardiac Enzymes No results found for this basename: TROPONINI, PROBNP,  in the last 168 hours Glucose  Recent Labs Lab 06/11/13 1128 06/11/13 1515 06/11/13 1712 06/11/13 1951 06/12/13 0005 06/12/13 0401  GLUCAP 160* 242* 240* 136* 174* 263*     CXR:  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 Parainfluenza Pos on viral screen Worse am 3/21  P:   Transfer back to ICU bipap May need vent  CARDIOVASCULAR A: Sinus tachycardia - reactive (fever, agitation), restirciton resp at baseline Hypertension - reactive (agitation) P:  Treat underlying cause- agitations has really helped Tele monitoring Metoprolol prn Treat resp failure F/u echo  RENAL A: normal renal fx, reminder , low muscle mass P:   Maintenance IVFs to kvo limit phlebotomy Vit D level sent , requested by Dr Rana SnareLowe  GASTROINTESTINAL A: Chronic dysphagia H/O Nissen fundoplication,  function? Chronic G tube P:   Dc bolus TF  continuous feeds for now Consult nutrition appreciated Asp precautions ppi D/c esophagram for now  HEMATOLOGIC A:  Thrombocytopenia - ? Chronicity, med related? Dilution now? P:  Sub q hep Limit blood draws  INFECTIOUS A:  Severe sepsis Presumed PNA Parainfluenza pos P:   Expand back to aztreonam  ENDOCRINE A:  DM1, glu to 70 P:   lantus while on TF  NEUROLOGIC A: Cerebral palsy Muscle spasms Cognitive impairment Hearing impairment Agitation ("doesn't like doctors") P:   Cont VPA (usual dose is 375, 250, 375 mg) - simplified to 333 TID for now Minimize anxiety inducing stimuli - this has helped Father updated at bedside AM 3/21  TODAY'S SUMMARY:Worse, tfr back to ICU, bipap, prob intubation, expand ABX back to aztreonam, vanco. D/c levaquin  I have personally obtained a history, examined the patient, evaluated laboratory and imaging results, formulated the assessment and plan and placed orders. CRITICAL CARE: 40min  Caryl Bisatrick WrightMD Beeper  [phone]  Cell  [phone]  If no response or cell goes to voicemail, call beeper [phone]  Pulmonary and Critical Care Medicine [facility] Pager: [phone]  06/12/2013, 7:18 AM

## 2013-06-13 ENCOUNTER — Inpatient Hospital Stay (HOSPITAL_COMMUNITY): Payer: Managed Care, Other (non HMO)

## 2013-06-13 ENCOUNTER — Encounter (HOSPITAL_COMMUNITY): Payer: Self-pay | Admitting: Anesthesiology

## 2013-06-13 DIAGNOSIS — J9600 Acute respiratory failure, unspecified whether with hypoxia or hypercapnia: Secondary | ICD-10-CM

## 2013-06-13 LAB — URINALYSIS, ROUTINE W REFLEX MICROSCOPIC
Bilirubin Urine: NEGATIVE
Glucose, UA: NEGATIVE mg/dL
Hgb urine dipstick: NEGATIVE
KETONES UR: NEGATIVE mg/dL
NITRITE: NEGATIVE
PH: 7 (ref 5.0–8.0)
PROTEIN: NEGATIVE mg/dL
Specific Gravity, Urine: 1.022 (ref 1.005–1.030)
Urobilinogen, UA: 1 mg/dL (ref 0.0–1.0)

## 2013-06-13 LAB — BLOOD GAS, ARTERIAL
Acid-Base Excess: 18.7 mmol/L — ABNORMAL HIGH (ref 0.0–2.0)
Acid-Base Excess: 19.9 mmol/L — ABNORMAL HIGH (ref 0.0–2.0)
BICARBONATE: 47.3 meq/L — AB (ref 20.0–24.0)
Bicarbonate: 46.3 mEq/L — ABNORMAL HIGH (ref 20.0–24.0)
DRAWN BY: 27022
Drawn by: 28120
FIO2: 0.6 %
FIO2: 0.7 %
MECHVT: 320 mL
O2 Saturation: 94.6 %
O2 Saturation: 97.7 %
PCO2 ART: 80 mmHg — AB (ref 35.0–45.0)
PEEP/CPAP: 10 cmH2O
PEEP: 8 cmH2O
PH ART: 7.378 (ref 7.350–7.450)
Patient temperature: 100.3
Patient temperature: 97.6
Pressure control: 24 cmH2O
RATE: 18 resp/min
RATE: 30 resp/min
TCO2: 51.2 mmol/L (ref 0–100)
TCO2: 48.9 mmol/L (ref 0–100)
pCO2 arterial: 0 mmHg — CL (ref 35.0–45.0)
pH, Arterial: 7.185 — CL (ref 7.350–7.450)
pO2, Arterial: 83.7 mmHg (ref 80.0–100.0)
pO2, Arterial: 86.9 mmHg (ref 80.0–100.0)

## 2013-06-13 LAB — STREP PNEUMONIAE URINARY ANTIGEN: Strep Pneumo Urinary Antigen: NEGATIVE

## 2013-06-13 LAB — GLUCOSE, CAPILLARY
GLUCOSE-CAPILLARY: 140 mg/dL — AB (ref 70–99)
Glucose-Capillary: 103 mg/dL — ABNORMAL HIGH (ref 70–99)
Glucose-Capillary: 153 mg/dL — ABNORMAL HIGH (ref 70–99)
Glucose-Capillary: 171 mg/dL — ABNORMAL HIGH (ref 70–99)
Glucose-Capillary: 181 mg/dL — ABNORMAL HIGH (ref 70–99)
Glucose-Capillary: 192 mg/dL — ABNORMAL HIGH (ref 70–99)
Glucose-Capillary: 193 mg/dL — ABNORMAL HIGH (ref 70–99)

## 2013-06-13 LAB — POCT I-STAT 3, ART BLOOD GAS (G3+)
Acid-Base Excess: 23 mmol/L — ABNORMAL HIGH (ref 0.0–2.0)
BICARBONATE: 51.9 meq/L — AB (ref 20.0–24.0)
O2 Saturation: 91 %
PCO2 ART: 77 mmHg — AB (ref 35.0–45.0)
PH ART: 7.437 (ref 7.350–7.450)
TCO2: >50 mmol/L (ref 0–100)
pO2, Arterial: 62 mmHg — ABNORMAL LOW (ref 80.0–100.0)

## 2013-06-13 LAB — URINE MICROSCOPIC-ADD ON

## 2013-06-13 LAB — VALPROIC ACID LEVEL: Valproic Acid Lvl: 77.3 ug/mL (ref 50.0–100.0)

## 2013-06-13 LAB — VANCOMYCIN, TROUGH: VANCOMYCIN TR: 12.5 ug/mL (ref 10.0–20.0)

## 2013-06-13 MED ORDER — ARTIFICIAL TEARS OP OINT
1.0000 "application " | TOPICAL_OINTMENT | Freq: Three times a day (TID) | OPHTHALMIC | Status: DC
  Administered 2013-06-13 – 2013-06-15 (×6): 1 via OPHTHALMIC
  Filled 2013-06-13: qty 3.5

## 2013-06-13 MED ORDER — ETOMIDATE 2 MG/ML IV SOLN
20.0000 mg | Freq: Once | INTRAVENOUS | Status: AC
  Administered 2013-06-13: 20 mg via INTRAVENOUS

## 2013-06-13 MED ORDER — LORAZEPAM 2 MG/ML IJ SOLN
0.5000 mg | Freq: Once | INTRAMUSCULAR | Status: AC
  Administered 2013-06-13: 0.5 mg via INTRAVENOUS

## 2013-06-13 MED ORDER — FENTANYL CITRATE 0.05 MG/ML IJ SOLN
INTRAMUSCULAR | Status: AC
  Filled 2013-06-13: qty 4

## 2013-06-13 MED ORDER — FENTANYL CITRATE 0.05 MG/ML IJ SOLN: 50.0000 ug | Freq: Once | INTRAMUSCULAR | Status: DC

## 2013-06-13 MED ORDER — FUROSEMIDE 10 MG/ML IJ SOLN
40.0000 mg | Freq: Once | INTRAMUSCULAR | Status: AC
  Administered 2013-06-13: 40 mg via INTRAVENOUS
  Filled 2013-06-13: qty 4

## 2013-06-13 MED ORDER — FENTANYL BOLUS VIA INFUSION
50.0000 ug | INTRAVENOUS | Status: DC | PRN
  Filled 2013-06-13: qty 100

## 2013-06-13 MED ORDER — VALPROATE SODIUM 500 MG/5ML IV SOLN
300.0000 mg | Freq: Three times a day (TID) | INTRAVENOUS | Status: DC
  Administered 2013-06-13 – 2013-06-22 (×28): 300 mg via INTRAVENOUS
  Filled 2013-06-13 (×33): qty 3

## 2013-06-13 MED ORDER — CISATRACURIUM BOLUS VIA INFUSION
7.0000 mg | Freq: Once | INTRAVENOUS | Status: AC
  Administered 2013-06-13: 7 mg via INTRAVENOUS
  Filled 2013-06-13: qty 7

## 2013-06-13 MED ORDER — FENTANYL CITRATE 0.05 MG/ML IJ SOLN
INTRAMUSCULAR | Status: AC
  Administered 2013-06-13: 100 ug
  Filled 2013-06-13: qty 2

## 2013-06-13 MED ORDER — ROCURONIUM BROMIDE 50 MG/5ML IV SOLN
50.0000 mg | Freq: Once | INTRAVENOUS | Status: AC
  Administered 2013-06-13: 50 mg via INTRAVENOUS

## 2013-06-13 MED ORDER — SODIUM CHLORIDE 0.9 % IV SOLN
0.0000 ug/h | INTRAVENOUS | Status: DC
  Administered 2013-06-13: 200 ug/h via INTRAVENOUS
  Administered 2013-06-13: 50 ug/h via INTRAVENOUS
  Administered 2013-06-14: 100 ug/h via INTRAVENOUS
  Administered 2013-06-15: 200 ug/h via INTRAVENOUS
  Administered 2013-06-16: 100 ug/h via INTRAVENOUS
  Administered 2013-06-18: 50 ug/h via INTRAVENOUS
  Administered 2013-06-19: 12.5 ug/h via INTRAVENOUS
  Filled 2013-06-13 (×7): qty 50

## 2013-06-13 MED ORDER — METOPROLOL TARTRATE 1 MG/ML IV SOLN
2.0000 mg | INTRAVENOUS | Status: DC | PRN
  Administered 2013-06-13: 2 mg via INTRAVENOUS

## 2013-06-13 MED ORDER — SODIUM CHLORIDE 0.9 % IV SOLN
0.0000 ug/kg/min | INTRAVENOUS | Status: DC
  Administered 2013-06-13: 0.5 ug/kg/min via INTRAVENOUS
  Filled 2013-06-13: qty 20

## 2013-06-13 MED ORDER — MIDAZOLAM HCL 2 MG/2ML IJ SOLN
INTRAMUSCULAR | Status: AC
  Administered 2013-06-13: 2 mg
  Filled 2013-06-13: qty 2

## 2013-06-13 MED ORDER — MIDAZOLAM HCL 2 MG/2ML IJ SOLN
INTRAMUSCULAR | Status: AC
  Filled 2013-06-13: qty 4

## 2013-06-13 MED ORDER — METOPROLOL TARTRATE 1 MG/ML IV SOLN
INTRAVENOUS | Status: AC
  Filled 2013-06-13: qty 5

## 2013-06-13 MED ORDER — SODIUM CHLORIDE 0.9 % IV SOLN
0.0000 mg/h | INTRAVENOUS | Status: DC
  Administered 2013-06-13: 1 mg/h via INTRAVENOUS
  Administered 2013-06-14: 3 mg/h via INTRAVENOUS
  Administered 2013-06-15: 8 mg/h via INTRAVENOUS
  Administered 2013-06-15: 3 mg/h via INTRAVENOUS
  Administered 2013-06-16 – 2013-06-17 (×2): 2 mg/h via INTRAVENOUS
  Filled 2013-06-13 (×8): qty 10

## 2013-06-13 MED ORDER — LEVALBUTEROL HCL 0.63 MG/3ML IN NEBU
0.6300 mg | INHALATION_SOLUTION | Freq: Four times a day (QID) | RESPIRATORY_TRACT | Status: DC
  Administered 2013-06-13 – 2013-06-24 (×44): 0.63 mg via RESPIRATORY_TRACT
  Filled 2013-06-13 (×68): qty 3

## 2013-06-13 NOTE — Progress Notes (Signed)
Point of Contact CCM Note:  At bedside, patient with RR 25-30, abdominal breathing with increased WOB.  Patient is tachycardic in the 120's, possibly from agitation.  Previously given ativan with improvement of agitation, tachycardia, however continued increase WOB.  Called to bedside for evaluation, patient with above findings, with WOB being the primary concern.  Prior NTS with thick mucoid secretions.  Discussed with RT who will have intubating equipment available, evaluated airway, with 7.0 ET tube, anatomy distorted with kyphosis, unable to eval mouth due to bipap, likely difficult intubation.    Reviewed CXR, prior ABG's, vitals, and prior notes.   Discussed with EICU, will get ABG to see if patient is compensating.  Possibly, may need to bag (as has been previously) with NTS to clear secretions, however issues with deep suctioning with inability to pass a nasal catheter during the last few attempts.   Leontine LocketJ. Cody Danniela Mcbrearty, MD Pager [phone]

## 2013-06-13 NOTE — Progress Notes (Signed)
Pt now intubated for resp failure. Please contact our GI hospital team when pt is extubated, resp status is near his baseline and he is ready for UGI series. Thank you.

## 2013-06-13 NOTE — Progress Notes (Signed)
Dr. Delford FieldWright notified of tachycardia. ETT has been repositioned. Placed order for STAT PCXR, per his request, to reassess the position of ETT.. RT to switch out vent.per order.

## 2013-06-13 NOTE — Procedures (Signed)
Bronchoscopy Procedure Note  Date of Operation: 06/13/2013  Pre-op Diagnosis: mucus plugging  Post-op Diagnosis:resp failure   Surgeon: Shan LevansPatrick Zaydrian Batta  Anesthesia: Monitored Local Anesthesia with Sedation Pt intubated  Operation: Flexible fiberoptic bronchoscopy, diagnostic   Findings: no mucus plugging   Specimen: bronch wash  Estimated Blood Loss: none  Complications: none  Indications and History: The patient is a 28 y.o. male with pna.    Description of Procedure: The patient was re-examined in the bronchoscopy suite and the site of surgery properly noted/marked.  The patient was identified as Thomas Conley and the procedure verified as Flexible Fiberoptic Bronchoscopy.    Fob done via adapter.  No mucus plugging seen.  Secretions removed for culture   Attestation: I performed the procedure.  Shan LevansPatrick Preslei Blakley

## 2013-06-13 NOTE — Procedures (Signed)
Central Venous Catheter Insertion Procedure Note Thomas FermoDaniel F Conley [id number] 1985-06-29  Procedure: Insertion of Central Venous Catheter Indications: Assessment of intravascular volume, Drug and/or fluid administration and Frequent blood sampling  Procedure Details Consent: Risks of procedure as well as the alternatives and risks of each were explained to the (patient/caregiver).  Consent for procedure obtained. Time Out: Verified patient identification, verified procedure, site/side was marked, verified correct patient position, special equipment/implants available, medications/allergies/relevent history reviewed, required imaging and test results available.  Performed Real time US used to ID and cannulate vessel  Maximum sterile technique was used including antiseptics, cap, gloves, gown, hand hygiene, mask and sheet. Skin prep: Chlorhexidine; local anesthetic administered A antimicrobial bonded/coated triple lumen catheter was placed in the right internal jugular vein using the Seldinger technique.  Evaluation Blood flow good Complications: No apparent complications Patient did tolerate procedure well. Chest X-ray ordered to verify placement.  CXR: pending.  BABCOCK,PETE 06/13/2013, 8:46 AM  Dorcas CarrowPatrick WrightMD

## 2013-06-13 NOTE — Progress Notes (Signed)
RT and charge RT attempted to NT suction pt. But was unable to pass the catheter through the airway.

## 2013-06-13 NOTE — Progress Notes (Signed)
Vent changed out per RT. Pt. Tolerated well.

## 2013-06-13 NOTE — Progress Notes (Addendum)
Point of Contact CCM Note:  ABG reviewed, pH >7.4 CO2 77/pO2 62  Discussed with RT and nursing staff, if work of breathing worsens, or any increased FiO2 requirements, will alert CCM team.  Deferring intubation and continue current care.  I/O's reviewed, patient with good urine output, amount not recorded.  Family updated, explained situation to them and answered all questions  Total critical care time:  30 minutes  J. Derrel Nipody Tilman Mcclaren, MD Pager [phone]

## 2013-06-13 NOTE — Progress Notes (Signed)
ANTIBIOTIC CONSULT NOTE - FOLLOW UP  Pharmacy Consult for Vancomycin Indication: pneumonia  Allergies  Allergen Reactions  . Other Anaphylaxis    *all narcotics*  . Augmentin [Amoxicillin-Pot Clavulanate]   . Cefuroxime Axetil   . Cephalosporins   . Klonopin [Clonazepam]   . Morphine And Related   . Sulfa Antibiotics   . Tegretol [Carbamazepine]     Patient Measurements: Height: 5' (152.4 cm) Weight: 98 lb 8.7 oz (44.7 kg) IBW/kg (Calculated) : 50 Adjusted Body Weight:   Vital Signs: Temp: 100.3 F (37.9 C) (03/22 1300) Temp src: Oral (03/22 1300) BP: 101/65 mmHg (03/22 1934) Pulse Rate: 137 (03/22 1934) Intake/Output from previous day: 03/21 0701 - 03/22 0700 In: 2029 [I.V.:205; NG/GT:30; IV Piggyback:650] Out: -  Intake/Output from this shift:    Labs:  Recent Labs  06/11/13 0250  WBC 6.3  HGB 12.2*  PLT 51*   Estimated Creatinine Clearance: 87.7 ml/min (by C-G formula based on Cr of 0.33).  Recent Labs  06/13/13 1630  [place] 12.5     Microbiology: Recent Results (from the past 720 hour(s))  CULTURE, BLOOD (ROUTINE X 2)     Status: None   Collection Time    05/24/2013  3:10 PM      Result Value Ref Range Status   Specimen Description BLOOD ARM RIGHT   Final   Special Requests BOTTLES DRAWN AEROBIC AND ANAEROBIC 5CC   Final   Culture  Setup Time     Final   Value: 06/21/2013 22:38     Performed at [facility]   Culture     Final   Value:        BLOOD CULTURE RECEIVED NO GROWTH TO DATE CULTURE WILL BE HELD FOR 5 DAYS BEFORE ISSUING A FINAL NEGATIVE REPORT     Performed at [facility]   Report Status PENDING   Incomplete  CULTURE, BLOOD (ROUTINE X 2)     Status: None   Collection Time    06/01/2013  3:52 PM      Result Value Ref Range Status   Specimen Description BLOOD HAND RIGHT   Final   Special Requests BOTTLES DRAWN AEROBIC ONLY 5.5CC   Final   Culture  Setup Time     Final   Value: 06/14/2013 18:53     Performed at  [facility]   Culture     Final   Value:        BLOOD CULTURE RECEIVED NO GROWTH TO DATE CULTURE WILL BE HELD FOR 5 DAYS BEFORE ISSUING A FINAL NEGATIVE REPORT     Performed at [facility]   Report Status PENDING   Incomplete  MRSA PCR SCREENING     Status: None   Collection Time    06/03/2013  7:28 PM      Result Value Ref Range Status   MRSA by PCR NEGATIVE  NEGATIVE Final   Comment:            The GeneXpert MRSA Assay (FDA     approved for NASAL specimens     only), is one component of a     comprehensive MRSA colonization     surveillance program. It is not     intended to diagnose MRSA     infection nor to guide or     monitor treatment for     MRSA infections.  RESPIRATORY VIRUS PANEL     Status: Abnormal   Collection Time  06/10/13  4:00 PM      Result Value Ref Range Status   Source - RVPAN NASAL SWAB   Corrected   Comment: CORRECTED ON 03/20 AT 2230: PREVIOUSLY REPORTED AS NASAL SWAB   Respiratory Syncytial Virus A NOT DETECTED   Final   Respiratory Syncytial Virus B NOT DETECTED   Final   Influenza A NOT DETECTED   Final   Influenza B NOT DETECTED   Final   Parainfluenza 1 NOT DETECTED   Final   Parainfluenza 2 NOT DETECTED   Final   Parainfluenza 3 DETECTED (*)  Final   Metapneumovirus NOT DETECTED   Final   Rhinovirus NOT DETECTED   Final   Adenovirus NOT DETECTED   Final   Influenza A H1 NOT DETECTED   Final   Influenza A H3 NOT DETECTED   Final   Comment: (NOTE)           Normal Reference Range for each Analyte: NOT DETECTED     Testing performed using the Luminex xTAG Respiratory Viral Panel test     kit.     This test was developed and its performance characteristics determined     by [facility]. It has not been cleared or approved by the [place]     Food and Drug Administration. This test is used for clinical purposes.     It should not be regarded as investigational or for research. This     laboratory is certified under the  [place] (CLIA) as qualified to perform high complexity     clinical laboratory testing.     Performed at [place]   Start     Dose/Rate Route Frequency Ordered Stop   06/12/13 1500  levofloxacin (LEVAQUIN) tablet 750 mg  Status:  Discontinued     750 mg Per Tube Every 24 hours 06/11/13 1535 06/12/13 0748   06/12/13 0900  vancomycin (VANCOCIN) IVPB 750 mg/150 ml premix     750 mg 150 mL/hr over 60 Minutes Intravenous Every 8 hours 06/12/13 0804     06/12/13 0900  aztreonam (AZACTAM) 1 g in dextrose 5 % 50 mL IVPB     1 g 100 mL/hr over 30 Minutes Intravenous 3 times per day 06/12/13 0804     06/11/13 1600  levofloxacin (LEVAQUIN) 25 MG/ML solution 750 mg  Status:  Discontinued     750 mg Per Tube Every 24 hours 06/11/13 1357 06/11/13 1535   06/11/13 1300  levofloxacin (LEVAQUIN) tablet 750 mg  Status:  Discontinued     750 mg Oral Daily 06/11/13 1148 06/11/13 1155   06/11/13 1300  levofloxacin (LEVAQUIN) tablet 750 mg  Status:  Discontinued     750 mg Per Tube Daily 06/11/13 1155 06/11/13 1356   06/10/13 0200  vancomycin (VANCOCIN) IVPB 750 mg/150 ml premix  Status:  Discontinued     750 mg 150 mL/hr over 60 Minutes Intravenous Every 8 hours 06/09/13 1743 06/10/13 1006   06/09/13 1745  vancomycin (VANCOCIN) IVPB 750 mg/150 ml premix     750 mg 150 mL/hr over 60 Minutes Intravenous NOW 06/09/13 1743 06/09/13 1945   06/09/13 1600  levofloxacin (LEVAQUIN) IVPB 750 mg  Status:  Discontinued     750 mg 100 mL/hr over 90 Minutes Intravenous Every 24 hours 06/06/2013 1700 06/11/13 1147   06/09/13 1500  aztreonam (AZACTAM) 1 g in dextrose 5 % 50 mL  IVPB  Status:  Discontinued     1 g 100 mL/hr over 30 Minutes Intravenous 3 times per day 06/09/13 1438 06/11/13 1145   06/09/13 0100  vancomycin (VANCOCIN) 500 mg in sodium chloride 0.9 % 100 mL IVPB  Status:  Discontinued     500 mg 100 mL/hr over 60 Minutes  Intravenous Every 8 hours 05/29/2013 1700 06/09/13 1743   06/11/2013 1530  levofloxacin (LEVAQUIN) IVPB 750 mg     750 mg 100 mL/hr over 90 Minutes Intravenous  Once 06/21/2013 1515 06/01/2013 1716   06/07/2013 1530  vancomycin (VANCOCIN) IVPB 1000 mg/200 mL premix     1,000 mg 200 mL/hr over 60 Minutes Intravenous  Once 06/15/2013 1515 05/25/2013 1816      Assessment: 28yo male with pneumonia, currently on Vancomycin 762m IV q8.  Vancomycin trough 12.5 this evening, drawn ~2hr late & expect to be within goal range.  Tm today was 100.3.  Goal of Therapy:  Vancomycin trough level 15-20 mcg/ml  Plan:  1-  Continue Vancomycin 7566mIV q8 2-  Watch renal fxn and f/u culture data  KeGracy BruinsPhSequatchie Hospital

## 2013-06-13 NOTE — Progress Notes (Signed)
PCCM  The pt has progressed with HR elevation, poor ventilation on PRVC  Pt will need NMB, PCV mode, diuresis, beta blockade.   Note Echo EF 70%  Dorcas CarrowPatrick WrightMD

## 2013-06-13 NOTE — Progress Notes (Signed)
PULMONARY / CRITICAL CARE MEDICINE   Name: Inetta FermoDaniel F Payton MRN: [id number] DOB: 1985-08-02    ADMISSION DATE:  10-07-13  REFERRING MD :  EDP PRIMARY SERVICE: PCCM  BRIEF PATIENT DESCRIPTION:  89 M with cerebral palsy and severe kyphoscoliosis admitted via ED with severe respiratory distress likely due to PNA  SIGNIFICANT EVENTS / STUDIES:  3/20- slow improvement 3/22 much worse, intubated , failed bipap  LINES / TUBES: R IJ CVL 3/22 ETT 3/22  CULTURES: Blood 3/17 >>  Viral 3/18>>>parainfluenza  Urine strep 3/18>>> resp cult 3/22>>  ANTIBIOTICS: Vanc 3/17 >>>3/19>>3/21 restart>> Levofloxacin 3/17 >>>plan stop date 3/24 Aztreonam 3/18>>>3/20>>3/21 restart>>  SUBJECTIVE:  Much worse, more distress, secretions are excess and diff to raise  VITAL SIGNS: Temp:  [97.5 F (36.4 C)-98.9 F (37.2 C)] 98.9 F (37.2 C) (03/22 0400) Pulse Rate:  [77-160] 99 (03/22 0840) Resp:  [15-40] 19 (03/22 0840) BP: (95-157)/(43-104) 148/69 mmHg (03/22 0800) SpO2:  [85 %-100 %] 98 % (03/22 0840) FiO2 (%):  [50 %-100 %] 100 % (03/22 0840) Weight:  [44.7 kg (98 lb 8.7 oz)] 44.7 kg (98 lb 8.7 oz) (03/22 0200) HEMODYNAMICS: tachycardia   VENTILATOR SETTINGS: Vent Mode:  [-] BIPAP FiO2 (%):  [50 %-100 %] 100 % Set Rate:  [15 bmp] 15 bmp PEEP:  [6 cmH20] 6 cmH20 Failed bipap, intubated 3/22  INTAKE / OUTPUT: Intake/Output     03/21 0701 - 03/22 0700 03/22 0701 - 03/23 0700   I.V. (mL/kg) 205 (4.6)    Other 1144    NG/GT 30    IV Piggyback 650    Total Intake(mL/kg) 2029 (45.4)    Urine (mL/kg/hr)     Total Output       Net +2029          Urine Occurrence 7 x      PHYSICAL EXAMINATION: General: Severely kyphoscoliotic. RASS +1, acute distress on bipap Neuro: CNs intact, diffuse muscle atrophy HEENT: NCAT, poor dentition Neck: JVP wnl Cardiovascular: improved tachy mild s1 s 2 Lungs: rhonchi, rales Abdomen: G tube, soft, diminished BS Ext: contractures, muscle  atrophy, symmetric brawny pedal edema Skin:  No lesions noted  LABS:  CBC  Recent Labs Lab 06/09/13 0220 06/10/13 0237 06/11/13 0250  WBC 6.7 6.7 6.3  HGB 13.6 12.2* 12.2*  HCT 41.6 38.4* 38.6*  PLT 73* 57* 51*   Coag's No results found for this basename: APTT, INR,  in the last 168 hours BMET  Recent Labs Lab 06/09/13 0220 06/10/13 0237 06/10/13 1220  NA 138 136* 137  K 4.3 4.7 4.6  CL 98 93* 95*  CO2 31 37* 35*  BUN 9 14 13   CREATININE 0.39* 0.39* 0.33*  GLUCOSE 116* 170* 149*   Electrolytes  Recent Labs Lab 06/09/13 0220 06/10/13 0237 06/10/13 1220  CALCIUM 8.7 9.1 9.1   Sepsis Markers  Recent Labs Lab 09-26-13 1552  LATICACIDVEN 2.27*   ABG  Recent Labs Lab 06/12/13 0847 06/12/13 2200 06/13/13 0415  PHART 7.467* 7.409 7.437  PCO2ART 68.4* 83.0* 77.0*  PO2ART 179.0* 170.0* 62.0*   Liver Enzymes  Recent Labs Lab 09-26-13 1452 06/10/13 0237  AST 59* 60*  ALT 43 37  ALKPHOS 160* 116  BILITOT 0.5 0.3  ALBUMIN 3.2* 2.5*   Cardiac Enzymes No results found for this basename: TROPONINI, PROBNP,  in the last 168 hours Glucose  Recent Labs Lab 06/12/13 0739 06/12/13 1120 06/12/13 1521 06/12/13 1952 06/12/13 2353 06/13/13 0355  GLUCAP 193* 220* 238*  146* 181* 192*     CXR:  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   Mitochondrial myopathy   GERD (gastroesophageal reflux disease)   Severe sepsis(995.92)   Scoliosis   PNA (pneumonia)   Acute respiratory failure with hypoxia   PULMONARY A: Acute resp failure with worsening failure, intubated Parainfluenza Pos on viral screen   P:   Full vent BDs Cult resp fob  CARDIOVASCULAR A: Sinus tachycardia - reactive (fever, agitation), restirciton resp at baseline Hypertension - reactive (agitation) P:   Tele monitoring Metoprolol prn F/u echo  RENAL A:  normal renal fx, reminder , low muscle mass P:   Maintenance IVFs at  kvo limit phlebotomy Vit D level sent , requested by Dr Rana Snare  GASTROINTESTINAL A: Chronic dysphagia H/O Nissen fundoplication, function? Chronic G tube P:    continuous feeds for now Consult nutrition appreciated Asp precautions ppi D/c esophagram for now  HEMATOLOGIC A:  Thrombocytopenia - ? Chronicity, med related? Dilution now? P:  Sub q hep Limit blood draws  INFECTIOUS A:  Severe sepsis Presumed PNA Parainfluenza pos P:   Expand back to aztreonam/vanc Cult resp  ENDOCRINE A:  DM1, glu to 70 P:   lantus while on TF  NEUROLOGIC A: Cerebral palsy Muscle spasms Cognitive impairment Hearing impairment Agitation ("doesn't like doctors") Seizure d/o P:   Change to tid 300mg  IV valproic acid Minimize anxiety inducing stimuli - this has helped   Both parents updated AM 3/22.  TODAY'S SUMMARY:Intubate, full vent, CVL, cult resp. FOB  I have personally obtained a history, examined the patient, evaluated laboratory and imaging results, formulated the assessment and plan and placed orders. CRITICAL CARE:  Caryl Bis  [phone]  Cell  [phone]  If no response or cell goes to voicemail, call beeper [phone]  Pulmonary and Critical Care Medicine [facility] Pager: [phone]  06/13/2013, 8:49 AM

## 2013-06-13 NOTE — Procedures (Signed)
Asked by Dr Delford FieldWright  to emergently intubate patient for respiratory failure. Chart reviewed, patient identified, and time-out performed. Sedation and relaxants given by Critical care team.   Larygoscopy performed by K. Maness CRNA using a pediatric glidescope blade and a number 7 tube, taped at 21cm at the lip.  Good bilateral breath sounds heard, ETCO2 positive.  Airway to RT. Start: 0755 End: [id number] Lonia SkinnerKevin D Bobbie Valletta, MD

## 2013-06-14 ENCOUNTER — Inpatient Hospital Stay (HOSPITAL_COMMUNITY): Payer: Managed Care, Other (non HMO)

## 2013-06-14 DIAGNOSIS — J122 Parainfluenza virus pneumonia: Secondary | ICD-10-CM

## 2013-06-14 DIAGNOSIS — G909 Disorder of the autonomic nervous system, unspecified: Secondary | ICD-10-CM

## 2013-06-14 DIAGNOSIS — J9589 Other postprocedural complications and disorders of respiratory system, not elsewhere classified: Secondary | ICD-10-CM

## 2013-06-14 DIAGNOSIS — J80 Acute respiratory distress syndrome: Secondary | ICD-10-CM

## 2013-06-14 DIAGNOSIS — M4120 Other idiopathic scoliosis, site unspecified: Secondary | ICD-10-CM

## 2013-06-14 DIAGNOSIS — E1049 Type 1 diabetes mellitus with other diabetic neurological complication: Secondary | ICD-10-CM

## 2013-06-14 LAB — GLUCOSE, CAPILLARY
GLUCOSE-CAPILLARY: 180 mg/dL — AB (ref 70–99)
GLUCOSE-CAPILLARY: 290 mg/dL — AB (ref 70–99)
GLUCOSE-CAPILLARY: 255 mg/dL — AB (ref 70–99)
GLUCOSE-CAPILLARY: 288 mg/dL — AB (ref 70–99)
Glucose-Capillary: 232 mg/dL — ABNORMAL HIGH (ref 70–99)

## 2013-06-14 LAB — POCT I-STAT 3, ART BLOOD GAS (G3+)
ACID-BASE EXCESS: 20 mmol/L — AB (ref 0.0–2.0)
BICARBONATE: 48.9 meq/L — AB (ref 20.0–24.0)
O2 Saturation: 94 %
PCO2 ART: 86.9 mmHg — AB (ref 35.0–45.0)
Patient temperature: 99
TCO2: >50 mmol/L (ref 0–100)
pH, Arterial: 7.359 (ref 7.350–7.450)
pO2, Arterial: 79 mmHg — ABNORMAL LOW (ref 80.0–100.0)

## 2013-06-14 LAB — BASIC METABOLIC PANEL
BUN: 13 mg/dL (ref 6–23)
CHLORIDE: 90 meq/L — AB (ref 96–112)
CO2: >45 mEq/L (ref 19–32)
Calcium: 8.7 mg/dL (ref 8.4–10.5)
Creatinine, Ser: 0.44 mg/dL — ABNORMAL LOW (ref 0.50–1.35)
GFR calc Af Amer: >90 mL/min (ref >90)
GFR calc non Af Amer: >90 mL/min (ref >90)
Glucose, Bld: 202 mg/dL — ABNORMAL HIGH (ref 70–99)
POTASSIUM: 3.3 meq/L — AB (ref 3.7–5.3)
Sodium: 139 mEq/L (ref 137–147)

## 2013-06-14 LAB — CBC
HEMATOCRIT: 32.1 % — AB (ref 39.0–52.0)
HEMOGLOBIN: 10.3 g/dL — AB (ref 13.0–17.0)
MCH: 31.9 pg (ref 26.0–34.0)
MCHC: 32.1 g/dL (ref 30.0–36.0)
MCV: 99.4 fL (ref 78.0–100.0)
Platelets: 56 10*3/uL — ABNORMAL LOW (ref 150–400)
RBC: 3.23 MIL/uL — ABNORMAL LOW (ref 4.22–5.81)
RDW: 14 % (ref 11.5–15.5)
WBC: 7.7 10*3/uL (ref 4.0–10.5)

## 2013-06-14 LAB — CULTURE, BLOOD (ROUTINE X 2)
CULTURE: NO GROWTH
Culture: NO GROWTH

## 2013-06-14 LAB — VALPROIC ACID LEVEL: Valproic Acid Lvl: 60.7 ug/mL (ref 50.0–100.0)

## 2013-06-14 MED ORDER — PANTOPRAZOLE SODIUM 40 MG PO PACK
40.0000 mg | PACK | Freq: Every day | ORAL | Status: DC
  Administered 2013-06-15 – 2013-06-24 (×10): 40 mg
  Filled 2013-06-14 (×11): qty 20

## 2013-06-14 MED ORDER — METOPROLOL TARTRATE 1 MG/ML IV SOLN
2.5000 mg | INTRAVENOUS | Status: DC | PRN
  Administered 2013-06-14 (×2): 2.5 mg via INTRAVENOUS
  Filled 2013-06-14 (×2): qty 5

## 2013-06-14 MED ORDER — METOPROLOL TARTRATE 1 MG/ML IV SOLN
INTRAVENOUS | Status: AC
  Administered 2013-06-14: 5 mg
  Filled 2013-06-14: qty 5

## 2013-06-14 MED ORDER — FENTANYL BOLUS VIA INFUSION
12.5000 ug | INTRAVENOUS | Status: DC | PRN
  Administered 2013-06-14 – 2013-06-16 (×3): 25 ug via INTRAVENOUS
  Administered 2013-06-17 (×3): 12.5 ug via INTRAVENOUS
  Administered 2013-06-20 – 2013-06-21 (×4): 25 ug via INTRAVENOUS
  Filled 2013-06-14: qty 25

## 2013-06-14 MED ORDER — DOCUSATE SODIUM 50 MG/5ML PO LIQD
100.0000 mg | Freq: Two times a day (BID) | ORAL | Status: DC
  Administered 2013-06-14 – 2013-06-24 (×22): 100 mg
  Filled 2013-06-14 (×25): qty 10

## 2013-06-14 MED ORDER — HEPARIN SODIUM (PORCINE) 5000 UNIT/ML IJ SOLN
5000.0000 [IU] | Freq: Two times a day (BID) | INTRAMUSCULAR | Status: DC
  Administered 2013-06-14 – 2013-06-17 (×6): 5000 [IU] via SUBCUTANEOUS
  Filled 2013-06-14 (×7): qty 1

## 2013-06-14 MED ORDER — FLEET ENEMA 7-19 GM/118ML RE ENEM
1.0000 | ENEMA | Freq: Once | RECTAL | Status: AC
  Administered 2013-06-14: 1 via RECTAL
  Filled 2013-06-14: qty 1

## 2013-06-14 MED ORDER — BIOTENE DRY MOUTH MT LIQD
15.0000 mL | Freq: Four times a day (QID) | OROMUCOSAL | Status: DC
  Administered 2013-06-14 – 2013-06-24 (×42): 15 mL via OROMUCOSAL

## 2013-06-14 MED ORDER — POTASSIUM CHLORIDE 20 MEQ/15ML (10%) PO LIQD
20.0000 meq | ORAL | Status: AC
  Administered 2013-06-14 (×2): 20 meq
  Filled 2013-06-14 (×2): qty 15

## 2013-06-14 MED ORDER — METOPROLOL TARTRATE 1 MG/ML IV SOLN
5.0000 mg | INTRAVENOUS | Status: DC | PRN
  Administered 2013-06-14: 5 mg via INTRAVENOUS
  Filled 2013-06-14: qty 5

## 2013-06-14 NOTE — Progress Notes (Signed)
CRITICAL VALUE ALERT  Critical value received:  CO2 on BMP >45  Date of notification:  06/14/13  Time of notification:  0523  Critical value read back:yes  Nurse who received alert:  Crist FatJoy Tinisha Etzkorn RN  MD notified (1st page):  Dr. Delton CoombesByrum  Time of first page:  0526(called elink)  MD notified (2nd page):NA  Time of second page:NA  Responding MD:  Delton CoombesByrum  Time MD responded:  [phone]

## 2013-06-14 NOTE — Progress Notes (Signed)
NUTRITION FOLLOW UP  Intervention:    Continue continuous regimen of Nutren 1.0 (pour 1,000 ml of Nutren 1.0 with fiber and 250 ml of Nutren 1.0 without fiber into feeding bag) run at 52 ml/h x 24 hours per day to provide 1250 kcals, 50 gm protein, 1050 ml free water daily.  Parents to supply Nutren 1.0 formula and RN to pour into a bag for feeding.  Nutrition Dx:   Inadequate oral intake related to chronic swallowing difficulty as evidenced by PEG in place for nutrition and NPO status. Ongoing.  Goal:   Intake to meet >90% of estimated nutrition needs. Unmet.  Monitor:   TF tolerance/adequacy, weight trend, labs, vent status.  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 He also has severe kyphoscoliosis and severe hearing impairment. He is cared for at home by his parents. He is followed by [facility] pediatrician, Dr Rana Snare. He presented to [facility] ED on day of admission with a 3-4 day history of fever, cough and progressive dyspnea to the point of respiratory distress on the morning of admission. Patient has a G tube for nutrition.  During first part of hospital admission, patient was receiving his home bolus TF regimen. There was a concern for aspiration of bolus feedings, so feeding regimen was changed to continuous. Patient's mom and dad prefer to use home formula (Nutren 1.0) for feedings via PEG. Patient continues to receive Nutren 1.0 at 52 ml/h to provide 1250 kcals, 50 gm protein, 1050 ml free water daily.  Patient required intubation on 3/22.   Patient is currently intubated on ventilator support.  MV: 7.9 L/min Temp (24hrs), Avg:98.4 F (36.9 C), Min:97.4 F (36.3 C), Max:99 F (37.2 C)   Height: Ht Readings from Last 1 Encounters:  06/17/2013 5' (1.524 m)    Weight Status:  stable Wt Readings from Last 1 Encounters:  06/14/13 100 lb 1.4 oz (45.4 kg)  17-Jun-2013  101 lb 10.1 oz (46.1 kg)  Re-estimated  needs:  Kcal: 1250-1500  Protein: 50-60 gm  Fluid: 1.2-1.5 L  Skin: no wounds  Diet Order:  NPO   Intake/Output Summary (Last 24 hours) at 06/14/13 1515 Last data filed at 06/14/13 1500  Gross per 24 hour  Intake 2549.77 ml  Output   1565 ml  Net 984.77 ml    Last BM: 3/19   Labs:   Recent Labs Lab 06/10/13 0237 06/10/13 1220 06/14/13 0414  NA 136* 137 139  K 4.7 4.6 3.3*  CL 93* 95* 90*  CO2 37* 35* >45*  BUN 14 13 13   CREATININE 0.39* 0.33* 0.44*  CALCIUM 9.1 9.1 8.7  GLUCOSE 170* 149* 202*    CBG (last 3)   Recent Labs  06/14/13 0401 06/14/13 0826 06/14/13 1136  GLUCAP 180* 232* 290*    Scheduled Meds: . antiseptic oral rinse  15 mL Mouth Rinse QID  . artificial tears  1 application Both Eyes 3 times per day  . aztreonam  1 g Intravenous 3 times per day  . chlorhexidine  15 mL Mouth Rinse BID  . docusate  100 mg Per Tube BID  . heparin subcutaneous  5,000 Units Subcutaneous Q12H  . insulin aspart  0-20 Units Subcutaneous 6 times per day  . levalbuterol  0.63 mg Nebulization Q6H  . pantoprazole sodium  40 mg Per Tube Q1200  . valproate sodium  300 mg Intravenous 3 times per day  .  vancomycin  750 mg Intravenous Q8H    Continuous Infusions: . cisatracurium (NIMBEX) infusion 0.8 mcg/kg/min (06/14/13 1449)  . dextrose 5 % and 0.9% NaCl 500 mL (06/13/13 0951)  . fentaNYL infusion INTRAVENOUS 100 mcg/hr (06/14/13 1237)  . midazolam (VERSED) infusion 3 mg/hr (06/14/13 0232)  . NUTREN 1.0/FIBER 52 mL/hr at 06/14/13 1304    Joaquin CourtsKimberly Tamey Wanek, RD, LDN, CNSC Pager [phone] After Hours Pager [phone]

## 2013-06-14 NOTE — Progress Notes (Addendum)
Vent changes made by MD. 

## 2013-06-14 NOTE — Progress Notes (Signed)
PULMONARY / CRITICAL CARE MEDICINE   Name: Thomas Conley MRN: [id number] DOB: 15-Nov-1985    ADMISSION DATE:  05/28/2013  REFERRING MD :  EDP PRIMARY SERVICE: PCCM  BRIEF PATIENT DESCRIPTION:  75 M with cerebral palsy and severe kyphoscoliosis admitted via ED with severe respiratory distress likely due to PNA  SIGNIFICANT EVENTS / STUDIES:  3/20- slow improvement. Transferred to SDU 3/21 worsenend resp status. Transferred back to ICU. NPPV resumed 3/21 Echo: LVEF 65-70% 3/22 much worse, failed NPPV, intubated 3/22 FOB: moderate secretions suctioned 3/23 Episodic severe desaturations requiring beg ventilation and suctioning  LINES / TUBES: R IJ CVL 3/22 >>  ETT 3/22 >>   CULTURES: Blood 3/17 >> NEG RVP 3/19 >> parainfluenza virus Strep Ag 3/22 >> NEG resp cult 3/22 >> cancelled  ANTIBIOTICS: Levofloxacin 3/17 >> 3/21 Vanc 3/17 >>>3/19, 3/21 >>  Aztreonam 3/18>>   SUBJECTIVE:  Episodic severe desaturations requiring beg ventilation and suctioning  VITAL SIGNS: Temp:  [97.4 F (36.3 C)-99 F (37.2 C)] 99 F (37.2 C) (03/23 1605) Pulse Rate:  [96-149] 148 (03/23 1500) Resp:  [15-30] 30 (03/23 1500) BP: (68-140)/(40-82) 122/82 mmHg (03/23 1500) SpO2:  [56 %-100 %] 98 % (03/23 1500) FiO2 (%):  [40 %-100 %] 70 % (03/23 1630) Weight:  [45.4 kg (100 lb 1.4 oz)] 45.4 kg (100 lb 1.4 oz) (03/23 0400)   VENTILATOR SETTINGS: Vent Mode:  [-] PCV FiO2 (%):  [40 %-100 %] 70 % Set Rate:  [30 bmp] 30 bmp PEEP:  [8 cmH20-10 cmH20] 8 cmH20 Plateau Pressure:  [24 cmH20-29 cmH20] 24 cmH20  INTAKE / OUTPUT: Intake/Output     03/22 0701 - 03/23 0700 03/23 0701 - 03/24 0700   I.V. (mL/kg) 747.9 (16.5) 329 (7.2)   Other 402 416   NG/GT     IV Piggyback 812 253   Total Intake(mL/kg) 1961.9 (43.2) 998 (22)   Urine (mL/kg/hr) 1181 (1.1) 385 (0.8)   Total Output 1181 385   Net +780.9 +613          PHYSICAL EXAMINATION: General: Severely kyphoscoliotic, sedated on  NMBs Neuro: Unable to examine due to NMBs HEENT: NSC Neck: JVP not elevated Cardiovascular: tachy, regular, no M Lungs: rhonchi throughout Abdomen: G tube, soft, diminished BS Ext: contractures, muscle atrophy, symmetric brawny pedal edema  LABS: I have reviewed all of today's lab results. 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   Mitochondrial myopathy   GERD (gastroesophageal reflux disease)   Severe sepsis(995.92)   Scoliosis   PNA (pneumonia)   Acute respiratory failure with hypoxia   PULMONARY A: Acute resp failure ARDS Parainfluenza virus bronchopneumonia P:   Vent settings reviewed and adjusted Cont vent bundle Daily SBT if/when indicated  CARDIOVASCULAR A: Sinus tachycardia - reactive Hypertension - resolved P:  Monitor BP and rhythm Low dose PRN metoprolol to maintain HR < 130/min  RENAL A: Hypokalemia P:   Monitor BMET intermittently Monitor I/Os Correct electrolytes as indicated   GASTROINTESTINAL A: Chronic dysphagia H/O Nissen fundoplication Chronic G tube P:   SUP: enteral PPI Cont TFs  HEMATOLOGIC A:  Thrombocytopenia - ? Chronicity P:  DVT px: SQ heparin Monitor CBC intermittently Transfuse per usual ICU guidelines Minimize phlebotomy  INFECTIOUS A:  Severe sepsis Presumed PNA Parainfluenza virus bronchopneumonia P:   Micro and abx  as above  ENDOCRINE A:  IDDM P:   Cont SSI Consider resumption of Lantus 3/24  NEUROLOGIC A: Cerebral palsy Chronic muscle contractures Severe baseline cognitive impairment Baseline hearing impairment Agitation Seizure d/o Ventilator dyssynchrony P:   Cont sedation with BIS goal 40-60 Cont NMBs until vent requirements reduced Cont AEDs Recheck VPA level 3/24  Both parents updated  AM 3/23.  TODAY'S SUMMARY:   I have personally obtained a history, examined the patient, evaluated laboratory and imaging results, formulated the assessment and plan and placed orders.  CRITICAL CARE: 50min inc multiple recheck   Billy Fischeravid Jonea Bukowski, MD ; [facility] Mobile [phone].  After 5:30 PM or weekends, call [phone]

## 2013-06-14 NOTE — Progress Notes (Signed)
[facility] FOR [facility] LAB REPLACEMENT ONLY  The patient does apply for the [facility] Electroly[facility] Protocol based on the criteria listed below:   1. Is GFR >/= 40 ml/min? yes  Patient's GFR today is >90 2. Is urine output >/= 0.5 ml/kg/hr for the last 6 hours? yes Patient's UOP is 1.1 ml/kg/hr 3. Is BUN < 60 mg/dL? yes  Patient's BUN today is 13 4. Abnormal electrolyte(s):K3.3 5. Ordered repletion with: per tube/KCL 6. If a panic level lab has been reported, has the CCM MD in charge been notified? yes.   Physician:  Langston Masker Byrum, MD  Melrose NakayamaChisholm, Marilin Kofman William 06/14/2013 5:18 AM

## 2013-06-14 NOTE — Progress Notes (Signed)
Bedside bronch performed by Dr.Simonds. Pt was pre/post hyperoxygenated. Vitals remained WNL throughout the procedure.  No complications noted. RT will monitor.

## 2013-06-14 NOTE — Progress Notes (Signed)
Inpatient Diabetes Program Recommendations  AACE/ADA: New Consensus Statement on Inpatient Glycemic Control (2013)  Target Ranges:  Prepandial:   less than 140 mg/dL      Peak postprandial:   less than 180 mg/dL (1-2 hours)      Critically ill patients:  140 - 180 mg/dL   Results for Thomas Conley, Kashius F (MRN [id number]) as of 06/14/2013 09:33  Ref. Range 06/12/2013 23:53 06/13/2013 03:55 06/13/2013 09:16 06/13/2013 11:19 06/13/2013 15:36 06/13/2013 19:47 06/13/2013 23:50 06/14/2013 04:01 06/14/2013 08:26  Glucose-Capillary Latest Range: 70-99 mg/dL 914181 (H) 782192 (H) 956140 (H) 171 (H) 193 (H) 153 (H) 103 (H) 180 (H) 232 (H)   Diabetes history: DM1 Outpatient Diabetes medications: Lantus 9-10 units daily, Novolog 2-10 units 5 times a day (sliding scale) Current orders for Inpatient glycemic control: Novolog 0-20 units Q4H  Inpatient Diabetes Program Recommendations Insulin - Basal: Please re-order Lantus 9 units QHS. Noted Lantus 9 units was discontinued on 3/22 at 14:24 by Dr. Delford FieldWright. Therefore, pt did not receive Lantus on 3/22 and CBG up to 232 mg/dl this morning.  Note: Patient has a history of Type 1 DM and requires basal insulin. Noted Lantus 9 units was ordered but discontinued yesterday at 14:24. Therefore, patient did not receive any Lantus on 3/22. CBG up to 232 mg/dl this morning at 2:138:26 am. Please re-order Lantus 9 units QHS.   Thanks, Orlando PennerMarie Jeannelle Wiens, RN, MSN, CCRN Diabetes Coordinator Inpatient Diabetes Program [phone] (Team Pager) [phone] (AP office) [phone] [facility] office)

## 2013-06-15 ENCOUNTER — Inpatient Hospital Stay (HOSPITAL_COMMUNITY): Payer: Managed Care, Other (non HMO)

## 2013-06-15 DIAGNOSIS — G7289 Other specified myopathies: Secondary | ICD-10-CM

## 2013-06-15 DIAGNOSIS — F79 Unspecified intellectual disabilities: Secondary | ICD-10-CM

## 2013-06-15 LAB — GLUCOSE, CAPILLARY
GLUCOSE-CAPILLARY: 285 mg/dL — AB (ref 70–99)
GLUCOSE-CAPILLARY: 284 mg/dL — AB (ref 70–99)
Glucose-Capillary: 244 mg/dL — ABNORMAL HIGH (ref 70–99)
Glucose-Capillary: 255 mg/dL — ABNORMAL HIGH (ref 70–99)
Glucose-Capillary: 266 mg/dL — ABNORMAL HIGH (ref 70–99)
Glucose-Capillary: 278 mg/dL — ABNORMAL HIGH (ref 70–99)

## 2013-06-15 LAB — COMPREHENSIVE METABOLIC PANEL
ALBUMIN: 1.7 g/dL — AB (ref 3.5–5.2)
ALK PHOS: 95 U/L (ref 39–117)
ALT: 17 U/L (ref 0–53)
AST: 29 U/L (ref 0–37)
BUN: 12 mg/dL (ref 6–23)
CALCIUM: 8.8 mg/dL (ref 8.4–10.5)
CO2: 44 mEq/L (ref 19–32)
Chloride: 93 mEq/L — ABNORMAL LOW (ref 96–112)
Creatinine, Ser: 0.5 mg/dL (ref 0.50–1.35)
GFR calc Af Amer: >90 mL/min (ref >90)
GFR calc non Af Amer: >90 mL/min (ref >90)
Glucose, Bld: 295 mg/dL — ABNORMAL HIGH (ref 70–99)
POTASSIUM: 4.1 meq/L (ref 3.7–5.3)
SODIUM: 139 meq/L (ref 137–147)
Total Bilirubin: 0.2 mg/dL — ABNORMAL LOW (ref 0.3–1.2)
Total Protein: 4.3 g/dL — ABNORMAL LOW (ref 6.0–8.3)

## 2013-06-15 LAB — CULTURE, RESPIRATORY W GRAM STAIN: Special Requests: NORMAL

## 2013-06-15 LAB — CBC
HCT: 31.2 % — ABNORMAL LOW (ref 39.0–52.0)
Hemoglobin: 10 g/dL — ABNORMAL LOW (ref 13.0–17.0)
MCH: 32.4 pg (ref 26.0–34.0)
MCHC: 32.1 g/dL (ref 30.0–36.0)
MCV: 101 fL — ABNORMAL HIGH (ref 78.0–100.0)
Platelets: 66 10*3/uL — ABNORMAL LOW (ref 150–400)
RBC: 3.09 MIL/uL — ABNORMAL LOW (ref 4.22–5.81)
RDW: 14.4 % (ref 11.5–15.5)
WBC: 8.3 10*3/uL (ref 4.0–10.5)

## 2013-06-15 LAB — VALPROIC ACID LEVEL: VALPROIC ACID LVL: 69.3 ug/mL (ref 50.0–100.0)

## 2013-06-15 LAB — CULTURE, RESPIRATORY

## 2013-06-15 MED ORDER — METOPROLOL TARTRATE 1 MG/ML IV SOLN
2.5000 mg | INTRAVENOUS | Status: DC | PRN
  Administered 2013-06-15 (×2): 5 mg via INTRAVENOUS
  Administered 2013-06-16: 2.5 mg via INTRAVENOUS
  Administered 2013-06-16 – 2013-06-17 (×4): 5 mg via INTRAVENOUS
  Administered 2013-06-19 – 2013-06-20 (×2): 2.5 mg via INTRAVENOUS
  Administered 2013-06-20 (×2): 5 mg via INTRAVENOUS
  Filled 2013-06-15 (×8): qty 5

## 2013-06-15 MED ORDER — ACETAMINOPHEN 160 MG/5ML PO SOLN
650.0000 mg | Freq: Once | ORAL | Status: AC
  Administered 2013-06-15: 650 mg
  Filled 2013-06-15: qty 20.3

## 2013-06-15 MED ORDER — METOPROLOL TARTRATE 1 MG/ML IV SOLN
INTRAVENOUS | Status: AC
  Filled 2013-06-15: qty 5

## 2013-06-15 MED ORDER — SODIUM CHLORIDE 0.9 % IV BOLUS (SEPSIS)
1000.0000 mL | Freq: Once | INTRAVENOUS | Status: AC
  Administered 2013-06-15: 1000 mL via INTRAVENOUS

## 2013-06-15 MED ORDER — INSULIN GLARGINE 100 UNIT/ML ~~LOC~~ SOLN
10.0000 [IU] | Freq: Every day | SUBCUTANEOUS | Status: DC
  Administered 2013-06-15 – 2013-06-16 (×2): 10 [IU] via SUBCUTANEOUS
  Filled 2013-06-15 (×2): qty 0.1

## 2013-06-15 MED ORDER — METOPROLOL TARTRATE 25 MG/10 ML ORAL SUSPENSION
25.0000 mg | Freq: Two times a day (BID) | ORAL | Status: DC
  Administered 2013-06-15 – 2013-06-17 (×4): 25 mg
  Filled 2013-06-15 (×6): qty 10

## 2013-06-15 NOTE — Progress Notes (Signed)
No SBT/wean done due to high PEEP/FiO2 requirements. No complications noted. RT will monitor.

## 2013-06-15 NOTE — Progress Notes (Signed)
PULMONARY / CRITICAL CARE MEDICINE   Name: Thomas Conley MRN: [id number] DOB: March 27, 1985    ADMISSION DATE:  06/03/2013  REFERRING MD :  EDP PRIMARY SERVICE: PCCM  BRIEF PATIENT DESCRIPTION:  [age] M with cerebral palsy and severe kyphoscoliosis admitted via ED with severe respiratory distress likely due to PNA  SIGNIFICANT EVENTS / STUDIES:  3/20- slow improvement. Transferred to SDU 3/21 worsenend resp status. Transferred back to ICU. NPPV resumed 3/21 Echo: LVEF 65-70% 3/22 much worse, failed NPPV, intubated 3/22 FOB: moderate secretions suctioned 3/23 Episodic severe desaturations requiring beg ventilation and suctioning  LINES / TUBES: R IJ CVL 3/22 >>  ETT 3/22 >>   CULTURES: Blood 3/17 >> NEG RVP 3/19 >> parainfluenza virus Strep Ag 3/22 >> NEG resp cult 3/22 >> NOF  ANTIBIOTICS: Levofloxacin 3/17 >> 3/21 Vanc 3/17 >>>3/19, 3/21 >>  Aztreonam 3/18>>   SUBJECTIVE:  NMBs stopped this AM with only mild ventilatory dyssynchrony. RASS -4  VITAL SIGNS: Temp:  [98.7 F (37.1 C)-101.3 F (38.5 C)] 98.9 F (37.2 C) (03/24 1626) Pulse Rate:  [104-147] 142 (03/24 1545) Resp:  [23-37] 37 (03/24 1545) BP: (115-170)/(49-86) 127/77 mmHg (03/24 1500) SpO2:  [92 %-100 %] 96 % (03/24 1545) FiO2 (%):  [40 %-70 %] 50 % (03/24 1545) Weight:  [47.4 kg (104 lb 8 oz)] 47.4 kg (104 lb 8 oz) (03/24 0400)   VENTILATOR SETTINGS: Vent Mode:  [-] PCV FiO2 (%):  [40 %-70 %] 50 % Set Rate:  [30 bmp-35 bmp] 35 bmp PEEP:  [8 cmH20] 8 cmH20 Plateau Pressure:  [27 cmH20-31 cmH20] 30 cmH20  INTAKE / OUTPUT: Intake/Output     03/23 0701 - 03/24 0700 03/24 0701 - 03/25 0700   I.V. (mL/kg) 836.6 (17.7) 209 (4.4)   Other 1248 312   NG/GT 190    IV Piggyback 759 253   Total Intake(mL/kg) 3033.6 (64) 774 (16.3)   Urine (mL/kg/hr) 910 (0.8) 400 (0.8)   Total Output 910 400   Net +2123.6 +374          PHYSICAL EXAMINATION: General: Severely kyphoscoliotic, sedated Neuro: Unable  to examine due to NMBs HEENT: NSC Neck: JVP not elevated Cardiovascular: tachy, regular, no M Lungs: rhonchi throughout Abdomen: G tube, soft, diminished BS Ext: contractures, muscle atrophy, symmetric brawny pedal edema  LABS: I have reviewed all of today's lab results. 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   Mitochondrial myopathy   GERD (gastroesophageal reflux disease)   Severe sepsis(995.92)   Scoliosis   PNA (pneumonia)   Acute respiratory failure with hypoxia   ARDS (adult respiratory distress syndrome)   PULMONARY A: Acute resp failure ARDS Parainfluenza virus bronchopneumonia P:   Vent settings reviewed and adjusted Cont vent bundle Daily SBT if/when indicated Try to maintain off NMBs if possible  CARDIOVASCULAR A: Sinus tachycardia - reactive Hypertension - resolved P:  Monitor BP and rhythm Low dose PRN metoprolol to maintain HR < 130/min Scheduled metoprolol started 3/24  RENAL A: Hypokalemia, resolved P:   Monitor BMET intermittently Monitor I/Os Correct electrolytes as indicated  GASTROINTESTINAL A: Chronic dysphagia H/O Nissen fundoplication Chronic G tube P:   SUP: enteral PPI Cont TFs  HEMATOLOGIC A:  Thrombocytopenia - Appears to be chronic P:  DVT px: SQ heparin Monitor CBC intermittently Transfuse per usual ICU guidelines  Minimize phlebotomy  INFECTIOUS A:  Severe sepsis Presumed PNA Parainfluenza virus bronchopneumonia P:   Micro and abx as above  ENDOCRINE A:  IDDM P:   Cont SSI Resumption of Lantus 3/24  NEUROLOGIC A: Cerebral palsy Chronic muscle contractures Severe baseline cognitive impairment Baseline hearing impairment Agitation Seizure d/o Ventilator dyssynchrony P:   Cont sedation with BIS goal  40-60 Cont NMBs until vent requirements reduced Cont AEDs  Both parents updated AM 3/24.  TODAY'S SUMMARY:   I have personally obtained a history, examined the patient, evaluated laboratory and imaging results, formulated the assessment and plan and placed orders.  CRITICAL CARE: 40 min inc multiple recheck   Billy Fischer, MD ; [facility]iatric service Mobile [phone].  After 5:30 PM or weekends, call [phone]

## 2013-06-15 NOTE — Progress Notes (Signed)
eLink Physician-Brief Progress Note Patient Name: Thomas FermoDaniel F Conley DOB: 01-22-1986 MRN: [id number]  Date of Service  06/15/2013   HPI/Events of Note   Sinus tachycardia.  Has been febrile during the day.  Has received lopressor w/o improvement.  Getting high doses of versed/fentanyl infusions.   eICU Interventions  Will give tylenol x one in case sinus tachycardia related to recurrent fever.   Intervention Category Intermediate Interventions: Arrhythmia - evaluation and management  Makaylee Spielberg 06/15/2013, 4:53 PM

## 2013-06-15 NOTE — Progress Notes (Signed)
Decreased urinary output, pt very tachycardic in spite of lopressor scheduled and prn. Currently febrile 102.8. Elink contacted for bolus and tylenol.

## 2013-06-15 NOTE — Progress Notes (Signed)
Vent changes made per MD order. No complications noted. RT will monitor.

## 2013-06-15 NOTE — Progress Notes (Signed)
eLink Physician-Brief Progress Note Patient Name: Inetta FermoDaniel F Sadik DOB: [birth year] MRN: [id number]  Date of Service  06/15/2013   HPI/Events of Note   BP low  eICU Interventions  Give fluid bolus 1 liter NS   Intervention Category Intermediate Interventions: Hypovolemia - evaluation and management  Patsey Pitstick 06/15/2013, 5:40 PM

## 2013-06-16 LAB — GLUCOSE, CAPILLARY
GLUCOSE-CAPILLARY: 195 mg/dL — AB (ref 70–99)
Glucose-Capillary: 263 mg/dL — ABNORMAL HIGH (ref 70–99)
Glucose-Capillary: 276 mg/dL — ABNORMAL HIGH (ref 70–99)
Glucose-Capillary: 281 mg/dL — ABNORMAL HIGH (ref 70–99)
Glucose-Capillary: 287 mg/dL — ABNORMAL HIGH (ref 70–99)
Glucose-Capillary: 320 mg/dL — ABNORMAL HIGH (ref 70–99)

## 2013-06-16 LAB — VANCOMYCIN, TROUGH: Vancomycin Tr: 21.7 ug/mL — ABNORMAL HIGH (ref 10.0–20.0)

## 2013-06-16 LAB — VITAMIN D 1,25 DIHYDROXY
VITAMIN D 1, 25 (OH) TOTAL: 59 pg/mL (ref 18–72)
Vitamin D3 1, 25 (OH)2: 59 pg/mL

## 2013-06-16 MED ORDER — LEVOFLOXACIN 25 MG/ML PO SOLN
500.0000 mg | Freq: Every day | ORAL | Status: DC
  Administered 2013-06-16 – 2013-06-19 (×4): 500 mg
  Filled 2013-06-16 (×4): qty 20

## 2013-06-16 MED ORDER — INSULIN GLARGINE 100 UNIT/ML ~~LOC~~ SOLN
15.0000 [IU] | Freq: Every day | SUBCUTANEOUS | Status: DC
  Administered 2013-06-17 – 2013-06-18 (×2): 15 [IU] via SUBCUTANEOUS
  Filled 2013-06-16 (×2): qty 0.15

## 2013-06-16 MED ORDER — VANCOMYCIN HCL IN DEXTROSE 1-5 GM/200ML-% IV SOLN
1000.0000 mg | Freq: Two times a day (BID) | INTRAVENOUS | Status: DC
  Filled 2013-06-16: qty 200

## 2013-06-16 MED ORDER — INSULIN GLARGINE 100 UNIT/ML ~~LOC~~ SOLN
5.0000 [IU] | Freq: Once | SUBCUTANEOUS | Status: AC
  Administered 2013-06-16: 5 [IU] via SUBCUTANEOUS
  Filled 2013-06-16: qty 0.05

## 2013-06-16 NOTE — Progress Notes (Signed)
PULMONARY / CRITICAL CARE MEDICINE   Name: Thomas Conley MRN: [id number] DOB: [birth year]    ADMISSION DATE:  2013/07/05  REFERRING MD :  EDP PRIMARY SERVICE: PCCM  BRIEF PATIENT DESCRIPTION:  75 M with cerebral palsy and severe kyphoscoliosis admitted via ED with severe respiratory distress likely due to PNA  SIGNIFICANT EVENTS / STUDIES:  3/20- slow improvement. Transferred to SDU 3/21 worsenend resp status. Transferred back to ICU. NPPV resumed 3/21 Echo: LVEF 65-70% 3/22 much worse, failed NPPV, intubated 3/22 FOB: moderate secretions suctioned 3/23 Episodic severe desaturations requiring beg ventilation and suctioning  LINES / TUBES: R IJ CVL 3/22 >>  ETT 3/22 >>   CULTURES: Blood 3/17 >> NEG RVP 3/19 >> parainfluenza virus Strep Ag 3/22 >> NEG resp cult 3/22 >> NOF  ANTIBIOTICS:  Vanc 3/17 >>>3/19, 3/21 >> 3/25 Aztreonam 3/18>> 3/25 Levofloxacin 3/17 >> 3/21, 3/25 >>   SUBJECTIVE:  Improved gas exchange and vent synchrony. RASS -4.   VITAL SIGNS: Temp:  [97.4 F (36.3 C)-100.5 F (38.1 C)] 97.4 F (36.3 C) (03/25 1235) Pulse Rate:  [107-153] 125 (03/25 1400) Resp:  [21-37] 24 (03/25 1400) BP: (79-127)/(34-77) 100/53 mmHg (03/25 1400) SpO2:  [93 %-100 %] 97 % (03/25 1400) FiO2 (%):  [50 %] 50 % (03/25 1304) Weight:  [50.3 kg (110 lb 14.3 oz)] 50.3 kg (110 lb 14.3 oz) (03/25 0400)   VENTILATOR SETTINGS: Vent Mode:  [-] PCV FiO2 (%):  [50 %] 50 % Set Rate:  [24 bmp-35 bmp] 24 bmp PEEP:  [8 cmH20] 8 cmH20 Plateau Pressure:  [19 cmH20-30 cmH20] 28 cmH20  INTAKE / OUTPUT: Intake/Output     03/24 0701 - 03/25 0700 03/25 0701 - 03/26 0700   I.V. (mL/kg) 838.5 (16.7) 296.3 (5.9)   Other 1248 364   NG/GT 250    IV Piggyback 1759    Total Intake(mL/kg) 4095.5 (81.4) 660.3 (13.1)   Urine (mL/kg/hr) 775 (0.6) 220 (0.6)   Total Output 775 220   Net +3320.5 +440.3          PHYSICAL EXAMINATION: General: Severely kyphoscoliotic, sedated Neuro: RASS  -4, no spont movement HEENT: NSC Neck: JVP not elevated Cardiovascular: tachy, regular, no M Lungs: rhonchi throughout - improved Abdomen: G tube, soft, diminished BS Ext: contractures, muscle atrophy, symmetric brawny pedal edema  LABS: I have reviewed all of today's lab results. 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   Mitochondrial myopathy   GERD (gastroesophageal reflux disease)   Severe sepsis(995.92)   Scoliosis   PNA (pneumonia)   Acute respiratory failure with hypoxia   ARDS (adult respiratory distress syndrome)   PULMONARY A: Acute resp failure ARDS Parainfluenza virus bronchopneumonia P:   Vent settings reviewed and adjusted Cont vent bundle Daily SBT if/when indicated  CARDIOVASCULAR A: Sinus tachycardia - reactive Hypertension - resolved P:  Monitor BP and rhythm Low dose PRN metoprolol to maintain HR < 130/min Cont scheduled metoprolol  RENAL A: Hypokalemia, resolved P:   Monitor BMET intermittently Monitor I/Os Correct electrolytes as indicated  GASTROINTESTINAL A: Chronic dysphagia H/O Nissen fundoplication Chronic G tube P:   SUP: enteral PPI Cont TFs  HEMATOLOGIC A:  Thrombocytopenia - Appears to be chronic P:  DVT px: SQ heparin Monitor CBC intermittently Transfuse per usual ICU guidelines Minimize phlebotomy  INFECTIOUS A:  Severe sepsis Presumed  PNA Parainfluenza virus bronchopneumonia P:   Micro and abx as above  ENDOCRINE A:  IDDM P:   Cont SSI Increase Lantus 3/25  NEUROLOGIC A: Cerebral palsy Chronic muscle contractures Severe baseline cognitive impairment Baseline hearing impairment Agitation Seizure d/o Ventilator dyssynchrony P:   Cont sedation with RASS goal -3 Cont AEDs  Both parents updated AM 3/25.  TODAY'S  SUMMARY:   I have personally obtained a history, examined the patient, evaluated laboratory and imaging results, formulated the assessment and plan and placed orders.  CRITICAL CARE: 35 mins   Billy Fischeravid Simonds, MD ; [facility] Mobile [phone].  After 5:30 PM or weekends, call [phone]

## 2013-06-16 NOTE — Progress Notes (Signed)
No SBT/wean done due to high PEEP/FiO2 requirements. No other complications noted. RT will monitor.

## 2013-06-17 ENCOUNTER — Inpatient Hospital Stay (HOSPITAL_COMMUNITY): Payer: Managed Care, Other (non HMO)

## 2013-06-17 LAB — GLUCOSE, CAPILLARY
GLUCOSE-CAPILLARY: 213 mg/dL — AB (ref 70–99)
GLUCOSE-CAPILLARY: 217 mg/dL — AB (ref 70–99)
Glucose-Capillary: 199 mg/dL — ABNORMAL HIGH (ref 70–99)
Glucose-Capillary: 208 mg/dL — ABNORMAL HIGH (ref 70–99)
Glucose-Capillary: 216 mg/dL — ABNORMAL HIGH (ref 70–99)
Glucose-Capillary: 256 mg/dL — ABNORMAL HIGH (ref 70–99)

## 2013-06-17 LAB — BASIC METABOLIC PANEL
BUN: 15 mg/dL (ref 6–23)
CALCIUM: 8.8 mg/dL (ref 8.4–10.5)
CHLORIDE: 95 meq/L — AB (ref 96–112)
CO2: 43 mEq/L (ref 19–32)
Creatinine, Ser: 0.53 mg/dL (ref 0.50–1.35)
GFR calc Af Amer: >90 mL/min (ref >90)
GFR calc non Af Amer: >90 mL/min (ref >90)
GLUCOSE: 224 mg/dL — AB (ref 70–99)
Potassium: 4.9 mEq/L (ref 3.7–5.3)
Sodium: 140 mEq/L (ref 137–147)

## 2013-06-17 LAB — CBC
HEMATOCRIT: 29.7 % — AB (ref 39.0–52.0)
HEMOGLOBIN: 9.2 g/dL — AB (ref 13.0–17.0)
MCH: 31.6 pg (ref 26.0–34.0)
MCHC: 31 g/dL (ref 30.0–36.0)
MCV: 102.1 fL — ABNORMAL HIGH (ref 78.0–100.0)
PLATELETS: 90 10*3/uL — AB (ref 150–400)
RBC: 2.91 MIL/uL — AB (ref 4.22–5.81)
RDW: 15.2 % (ref 11.5–15.5)
WBC: 9.6 10*3/uL (ref 4.0–10.5)

## 2013-06-17 MED ORDER — BISACODYL 10 MG RE SUPP
10.0000 mg | Freq: Every day | RECTAL | Status: DC | PRN
  Administered 2013-06-17 – 2013-06-21 (×3): 10 mg via RECTAL
  Filled 2013-06-17 (×3): qty 1

## 2013-06-17 MED ORDER — METOPROLOL TARTRATE 25 MG/10 ML ORAL SUSPENSION
25.0000 mg | Freq: Three times a day (TID) | ORAL | Status: DC
  Administered 2013-06-17 – 2013-06-24 (×20): 25 mg
  Filled 2013-06-17 (×29): qty 10

## 2013-06-17 NOTE — Progress Notes (Signed)
NUTRITION FOLLOW UP  Intervention:    Continue continuous regimen of Nutren 1.0 (pour 1,000 ml of Nutren 1.0 with fiber and 250 ml of Nutren 1.0 without fiber into feeding bag) run at 52 ml/h x 24 hours per day to provide 1250 kcals, 50 gm protein, 1050 ml free water daily.  Parents to supply Nutren 1.0 formula and RN to pour into a bag for feeding.  Nutrition Dx:   Inadequate oral intake related to chronic swallowing difficulty as evidenced by PEG in place for nutrition and NPO status. Ongoing.  Goal:   Intake to meet >90% of estimated nutrition needs. Met.  Monitor:   TF tolerance/adequacy, weight trend, labs, vent status.  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 He also has severe kyphoscoliosis and severe hearing impairment. He is cared for at home by his parents. He is followed by [facility] pediatrician, Dr Corinna Capra. He presented to [facility] ED on day of admission with a 3-4 day history of fever, cough and progressive dyspnea to the point of respiratory distress on the morning of admission. Patient has a G tube for nutrition.  During first part of hospital admission, patient was receiving his home bolus TF regimen. There was a concern for aspiration of bolus feedings, so feeding regimen was changed to continuous. Patient's mom and dad prefer to use home formula (Nutren 1.0) for feedings via PEG. Patient continues to receive Nutren 1.0 at 52 ml/h to provide 1250 kcals, 50 gm protein, 1050 ml free water daily.  Patient required intubation on 3/22. Discussed patient in ICU rounds today. Glucose has been elevated, Lantus being adjusted.   Patient remains intubated on ventilator support.  MV: 6.6 L/min Temp (24hrs), Avg:99 F (37.2 C), Min:98.1 F (36.7 C), Max:100 F (37.8 C)   Height: Ht Readings from Last 1 Encounters:  06/17/2013 5' (1.524 m)    Weight Status:  stable Wt Readings from Last 1 Encounters:   06/16/13 110 lb 14.3 oz (50.3 kg)  06/11/2013  101 lb 10.1 oz (46.1 kg)  Re-estimated needs:  Kcal: 1250-1500  Protein: 50-60 gm  Fluid: 1.2-1.5 L  Skin: no wounds  Diet Order:  NPO   Intake/Output Summary (Last 24 hours) at 06/17/13 1414 Last data filed at 06/17/13 1100  Gross per 24 hour  Intake 2190.67 ml  Output    800 ml  Net 1390.67 ml    Last BM: 3/23   Labs:   Recent Labs Lab 06/14/13 0414 06/15/13 0400 06/17/13 0413  NA 139 139 140  K 3.3* 4.1 4.9  CL 90* 93* 95*  CO2 >45* 44* 43*  BUN $Re'13 12 15  'bRK$ CREATININE 0.44* 0.50 0.53  CALCIUM 8.7 8.8 8.8  GLUCOSE 202* 295* 224*    CBG (last 3)   Recent Labs  06/17/13 0351 06/17/13 0800 06/17/13 1151  GLUCAP 208* 213* 217*    Scheduled Meds: . antiseptic oral rinse  15 mL Mouth Rinse QID  . chlorhexidine  15 mL Mouth Rinse BID  . docusate  100 mg Per Tube BID  . insulin aspart  0-20 Units Subcutaneous 6 times per day  . insulin glargine  15 Units Subcutaneous Daily  . levalbuterol  0.63 mg Nebulization Q6H  . levofloxacin  500 mg Per Tube Daily  . metoprolol tartrate  25 mg Per Tube Q8H  . pantoprazole sodium  40 mg Per Tube Q1200  . valproate sodium  300 mg Intravenous 3 times per day    Continuous Infusions: . fentaNYL infusion INTRAVENOUS 50 mcg/hr (06/16/13 2000)  . midazolam (VERSED) infusion 2 mg/hr (06/17/13 1129)  . NUTREN 1.0/FIBER 52 mL/hr at 06/16/13 [place], RD, LDN, [place] Pager [phone] After Hours Pager [phone]

## 2013-06-17 NOTE — Progress Notes (Signed)
PULMONARY / CRITICAL CARE MEDICINE   Name: Thomas Conley MRN: [id number] DOB: 11-18-1985    ADMISSION DATE:  07-01-2013  REFERRING MD :  EDP PRIMARY SERVICE: PCCM  BRIEF PATIENT DESCRIPTION:  30 M with cerebral palsy and severe kyphoscoliosis admitted via ED with severe respiratory distress likely due to PNA  SIGNIFICANT EVENTS / STUDIES:  3/20- slow improvement. Transferred to SDU 3/21 worsenend resp status. Transferred back to ICU. NPPV resumed 3/21 Echo: LVEF 65-70% 3/22 much worse, failed NPPV, intubated 3/22 FOB: moderate secretions suctioned 3/23 Episodic severe desaturations requiring bag ventilation and suctioning. 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 Heavy sedation continued 3/26 Reduce sedation to moderate level 3/26 Extended discussion re: possibility of trach tube and anticipated implications/consequences   LINES / TUBES: R IJ CVL 3/22 >>  ETT 3/22 >>   CULTURES: Blood 3/17 >> NEG RVP 3/19 >> parainfluenza virus Strep Ag 3/22 >> NEG resp cult 3/22 >> NOF  ANTIBIOTICS:  Vanc 3/17 >>>3/19, 3/21 >> 3/25 Aztreonam 3/18>> 3/25 Levofloxacin 3/17 >> 3/21, 3/25 >>   SUBJECTIVE:  Improved gas exchange and vent synchrony. RASS -3.   VITAL SIGNS: Temp:  [98.1 F (36.7 C)-100 F (37.8 C)] 100 F (37.8 C) (03/26 1224) Pulse Rate:  [110-139] 130 (03/26 1400) Resp:  [17-32] 17 (03/26 1400) BP: (92-154)/(43-87) 132/62 mmHg (03/26 1400) SpO2:  [89 %-99 %] 95 % (03/26 1400) FiO2 (%):  [40 %-50 %] 40 % (03/26 1250)   VENTILATOR SETTINGS: Vent Mode:  [-] PCV FiO2 (%):  [40 %-50 %] 40 % Set Rate:  [16 bmp-24 bmp] 16 bmp PEEP:  [5 cmH20-8 cmH20] 5 cmH20 Plateau Pressure:  [25 cmH20-30 cmH20] 30 cmH20  INTAKE / OUTPUT: Intake/Output     03/25 0701 - 03/26 0700 03/26 0701 - 03/27 0700   I.V. (mL/kg) 1055 (21) 329 (6.5)   Other 1144 364   NG/GT 150    IV  Piggyback 159 53   Total Intake(mL/kg) 2508 (49.9) 746 (14.8)   Urine (mL/kg/hr) 730 (0.6) 570 (1.4)   Total Output 730 570   Net +1778 +176          PHYSICAL EXAMINATION: General: Severely kyphoscoliotic, sedated Neuro: RASS -4, no spont movement HEENT: NSC Neck: JVP not elevated Cardiovascular: tachy, regular, no M Lungs: rhonchi throughout - improved Abdomen: G tube, soft, diminished BS Ext: contractures, muscle atrophy, symmetric brawny pedal edema  LABS: I have reviewed all of today's lab results. 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   Mitochondrial myopathy   GERD (gastroesophageal reflux disease)   Severe sepsis(995.92)   Scoliosis   PNA (pneumonia)   Acute respiratory failure with hypoxia   ARDS (adult respiratory distress syndrome)   PULMONARY A: Acute resp failure ARDS, resolving Parainfluenza virus bronchopneumonia P:   Vent settings reviewed and adjusted Cont vent bundle Daily SBT if/when indicated  CARDIOVASCULAR A: Sinus tachycardia - reactive Hypertension - resolved P:  Monitor BP and rhythm Low dose PRN metoprolol to maintain HR < 130/min Increase scheduled metoprolol  RENAL A: Hypokalemia, resolved P:   Monitor BMET intermittently Monitor I/Os Correct electrolytes as indicated  GASTROINTESTINAL A: Chronic dysphagia H/O Nissen fundoplication Chronic G tube P:   SUP:  enteral PPI Cont TFs  HEMATOLOGIC A:  Thrombocytopenia - Appears to be chronic. Improving P:  DVT px: SCDs (SQ hep stopped 3/26 due to mild epistaxis) Monitor CBC intermittently Transfuse per usual ICU guidelines Minimize phlebotomy  INFECTIOUS A:  Severe sepsis, resolving Presumed PNA, radiographically improved Parainfluenza virus  bronchopneumonia P:   Micro and abx as above  ENDOCRINE A:  IDDM P:   Cont SSI Increased Lantus 3/25  NEUROLOGIC A: Cerebral palsy Chronic muscle contractures Severe baseline cognitive impairment Baseline hearing impairment Agitation Seizure d/o Ventilator dyssynchrony P:   Cont sedation with RASS goal -3 Cont AEDs  Family updated 3/26.  TODAY'S SUMMARY:   I have personally obtained a history, examined the patient, evaluated laboratory and imaging results, formulated the assessment and plan and placed orders.  CRITICAL CARE: 45 mins   Billy Fischeravid Laban Orourke, MD ; [facility] Mobile [phone].  After 5:30 PM or weekends, call [phone]

## 2013-06-17 NOTE — Progress Notes (Signed)
UR Completed.  Anniebell Bedore [name] [phone] 06/17/2013  

## 2013-06-17 NOTE — Progress Notes (Addendum)
Inpatient Diabetes Program Recommendations  AACE/ADA: New Consensus Statement on Inpatient Glycemic Control (2013)  Target Ranges:  Prepandial:   less than 140 mg/dL      Peak postprandial:   less than 180 mg/dL (1-2 hours)      Critically ill patients:  140 - 180 mg/dL   Results for Thomas Conley, Thomas Conley (MRN [id number]) as of 06/17/2013 09:03  Ref. Range 06/16/2013 07:37 06/16/2013 11:56 06/16/2013 15:29 06/16/2013 19:15 06/17/2013 00:12 06/17/2013 03:51 06/17/2013 08:00  Glucose-Capillary Latest Range: 70-99 mg/dL 409287 (H) 811320 (H) 914276 (H) 195 (H) 256 (H) 208 (H) 213 (H)   Diabetes history: DM1  Outpatient Diabetes medications: Lantus 9-10 units daily, Novolog 2-10 units 5 times a day (sliding scale)  Current orders for Inpatient glycemic control:Lantus 15 units daily (increased yesterday), Novolog 0-20 units Q4H  Inpatient Diabetes Program Recommendations Insulin - Basal: Please consider increasing Lantus to 18 units daily. Insulin - Meal Coverage: Please consider ordering Novolog 3 units Q4H for tube feeding coverage.  Thanks, Orlando PennerMarie Hutchinson Isenberg, RN, MSN, CCRN Diabetes Coordinator Inpatient Diabetes Program [phone] (Team Pager) [phone] ([place] office) [phone] [facility] office)

## 2013-06-18 LAB — GLUCOSE, CAPILLARY
GLUCOSE-CAPILLARY: 193 mg/dL — AB (ref 70–99)
GLUCOSE-CAPILLARY: 222 mg/dL — AB (ref 70–99)
Glucose-Capillary: 190 mg/dL — ABNORMAL HIGH (ref 70–99)
Glucose-Capillary: 233 mg/dL — ABNORMAL HIGH (ref 70–99)
Glucose-Capillary: 247 mg/dL — ABNORMAL HIGH (ref 70–99)
Glucose-Capillary: 171 mg/dL — ABNORMAL HIGH (ref 70–99)

## 2013-06-18 MED ORDER — INSULIN GLARGINE 100 UNIT/ML ~~LOC~~ SOLN
20.0000 [IU] | Freq: Every day | SUBCUTANEOUS | Status: DC
  Administered 2013-06-19 – 2013-06-22 (×4): 20 [IU] via SUBCUTANEOUS
  Filled 2013-06-18 (×6): qty 0.2

## 2013-06-18 MED ORDER — IPRATROPIUM BROMIDE 0.02 % IN SOLN
0.5000 mg | Freq: Four times a day (QID) | RESPIRATORY_TRACT | Status: DC
  Administered 2013-06-18 – 2013-06-24 (×23): 0.5 mg via RESPIRATORY_TRACT
  Filled 2013-06-18 (×24): qty 2.5

## 2013-06-18 MED ORDER — BUDESONIDE 0.25 MG/2ML IN SUSP
0.2500 mg | Freq: Four times a day (QID) | RESPIRATORY_TRACT | Status: DC
  Administered 2013-06-18 – 2013-06-24 (×24): 0.25 mg via RESPIRATORY_TRACT
  Filled 2013-06-18 (×28): qty 2

## 2013-06-18 MED ORDER — BUDESONIDE 0.25 MG/2ML IN SUSP
0.2500 mg | Freq: Four times a day (QID) | RESPIRATORY_TRACT | Status: DC
  Filled 2013-06-18 (×4): qty 2

## 2013-06-18 NOTE — Progress Notes (Signed)
PULMONARY / CRITICAL CARE MEDICINE   Name: Thomas Conley MRN: [id number] DOB: 1985-05-15    ADMISSION DATE:  05/27/2013  REFERRING MD :  EDP PRIMARY SERVICE: PCCM  BRIEF PATIENT DESCRIPTION:  72 M with cerebral palsy and severe kyphoscoliosis admitted via ED with severe respiratory distress likely due to PNA  SIGNIFICANT EVENTS / STUDIES:  3/20- slow improvement. Transferred to SDU 3/21 worsenend resp status. Transferred back to ICU. NPPV resumed 3/21 Echo: LVEF 65-70% 3/22 much worse, failed NPPV, intubated 3/22 FOB: moderate secretions suctioned 3/23 Episodic severe desaturations requiring bag ventilation and suctioning. 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 Heavy sedation continued 3/26 Reduce sedation to moderate level 3/26 Extended discussion re: possibility of trach tube and anticipated implications/consequences   LINES / TUBES: R IJ CVL 3/22 >>  ETT 3/22 >>   CULTURES: Blood 3/17 >> NEG RVP 3/19 >> parainfluenza virus Strep Ag 3/22 >> NEG resp cult 3/22 >> NOF  ANTIBIOTICS:  Vanc 3/17 >>>3/19, 3/21 >> 3/25 Aztreonam 3/18>> 3/25 Levofloxacin 3/17 >> 3/21, 3/25 >>   SUBJECTIVE:  Improving gas exchange and vent synchrony. RASS -2.   VITAL SIGNS: Temp:  [97.5 F (36.4 C)-98.7 F (37.1 C)] 97.5 F (36.4 C) (03/27 1251) Pulse Rate:  [102-145] 119 (03/27 1400) Resp:  [13-23] 14 (03/27 1400) BP: (99-137)/(44-80) 120/57 mmHg (03/27 1400) SpO2:  [91 %-99 %] 95 % (03/27 1400) FiO2 (%):  [40 %] 40 % (03/27 1617)   VENTILATOR SETTINGS: Vent Mode:  [-] PCV FiO2 (%):  [40 %] 40 % Set Rate:  [14 bmp-16 bmp] 14 bmp PEEP:  [5 cmH20] 5 cmH20 Plateau Pressure:  [25 cmH20-30 cmH20] 25 cmH20  INTAKE / OUTPUT: Intake/Output     03/26 0701 - 03/27 0700 03/27 0701 - 03/28 0700   I.V. (mL/kg) 890.5 (17.7) 169 (3.4)   Other 1196    NG/GT     IV Piggyback 159 53    Total Intake(mL/kg) 2245.5 (44.6) 222 (4.4)   Urine (mL/kg/hr) 1405 (1.2) 130 (0.3)   Total Output 1405 130   Net +840.5 +92          PHYSICAL EXAMINATION: General: Severely kyphoscoliotic, sedated Neuro: RASS -4, no spont movement HEENT: NSC Neck: JVP not elevated Cardiovascular: tachy, regular, no M Lungs: scattered wheezes Abdomen: G tube, soft, diminished BS Ext: contractures, muscle atrophy, symmetric brawny pedal edema  LABS: I have reviewed all of today's lab results. 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   Mitochondrial myopathy   GERD (gastroesophageal reflux disease)   Severe sepsis(995.92)   Scoliosis   PNA (pneumonia)   Acute respiratory failure with hypoxia   ARDS (adult respiratory distress syndrome)   PULMONARY A: Acute resp failure ARDS, resolving Parainfluenza virus bronchopneumonia P:   Vent settings reviewed and adjusted Cont vent bundle Daily SBT if/when indicated Would wean slowly in PCV mode over WE (tolerates better than PSV) Will decide on trial of extubation vs trach tube 3/30  CARDIOVASCULAR A: Sinus tachycardia - reactive Hypertension - resolved P:  Monitor BP and rhythm Low dose PRN metoprolol to maintain HR < 130/min Cont scheduled metoprolol  RENAL A: Hypokalemia, resolved P:   Monitor BMET intermittently Monitor I/Os Correct electrolytes as indicated  GASTROINTESTINAL A: Chronic  dysphagia H/O Nissen fundoplication Chronic G tube P:   SUP: enteral PPI Cont TFs  HEMATOLOGIC A:  Thrombocytopenia - Improving P:  DVT px: SCDs (SQ hep stopped 3/26 due to mild epistaxis) Monitor CBC intermittently Transfuse per usual ICU guidelines Minimize phlebotomy  INFECTIOUS A:  Severe sepsis, resolving Presumed PNA, radiographically  improved Parainfluenza virus bronchopneumonia P:   Micro and abx as above  ENDOCRINE A:  IDDM P:   Cont SSI Increased Lantus 3/27  NEUROLOGIC A: Cerebral palsy Chronic muscle contractures Severe baseline cognitive impairment Baseline hearing impairment Agitation Seizure d/o Ventilator dyssynchrony P:   Cont sedation with RASS goal -3 Cont AEDs  Family updated 3/27.  TODAY'S SUMMARY:   I have personally obtained a history, examined the patient, evaluated laboratory and imaging results, formulated the assessment and plan and placed orders.  CRITICAL CARE: 35 mins   Billy Fischeravid Georgi Tuel, MD ; [facility] Mobile [phone].  After 5:30 PM or weekends, call [phone]

## 2013-06-18 NOTE — Progress Notes (Signed)
Inpatient Diabetes Program Recommendations  AACE/ADA: New Consensus Statement on Inpatient Glycemic Control (2013)  Target Ranges:  Prepandial:   less than 140 mg/dL      Peak postprandial:   less than 180 mg/dL (1-2 hours)      Critically ill patients:  140 - 180 mg/dL   Results for Thomas FermoRIVADENEYRA, [name] (MRN [id number]) as of 06/18/2013 10:09  Ref. Range 06/17/2013 03:51 06/17/2013 08:00 06/17/2013 11:51 06/17/2013 15:29 06/17/2013 19:07 06/18/2013 00:49 06/18/2013 04:00 06/18/2013 08:04  Glucose-Capillary Latest Range: 70-99 mg/dL 409208 (H) 811213 (H) 914217 (H) 216 (H) 199 (H) 222 (H) 193 (H) 190 (H)   Diabetes history: DM1  Outpatient Diabetes medications: Lantus 9-10 units daily, Novolog 2-10 units 5 times a day (sliding scale)  Current orders for Inpatient glycemic control:Lantus 15 units daily (increased yesterday), Novolog 0-20 units Q4H   Inpatient Diabetes Program Recommendations Insulin - Basal: Please consider increasing Lantus to 18 units daily. Insulin - Meal Coverage: Please consider ordering Novolog 3 units Q4H for tube feeding coverage.  Note: Blood glucose ranged from 208-222 mg/dl on 7/82/953/26/15 and patient received a total of Novolog 43 units for glucose correction on 3/26 (in addition to Lantus 15 units daily). Please consider increasing Lantus to 18 units daily and ordering Novolog 3 units Q4H for tube feeding coverage.    Thanks, Orlando PennerMarie Cendy Oconnor, RN, MSN, CCRN Diabetes Coordinator Inpatient Diabetes Program [phone] (Team Pager) [phone] ([place] office) [phone] [facility] office)

## 2013-06-18 NOTE — Progress Notes (Signed)
RT Note- changed PCV to 20, tolerating well.

## 2013-06-19 ENCOUNTER — Inpatient Hospital Stay (HOSPITAL_COMMUNITY): Payer: Managed Care, Other (non HMO)

## 2013-06-19 LAB — GLUCOSE, CAPILLARY
GLUCOSE-CAPILLARY: 264 mg/dL — AB (ref 70–99)
GLUCOSE-CAPILLARY: 137 mg/dL — AB (ref 70–99)
GLUCOSE-CAPILLARY: 133 mg/dL — AB (ref 70–99)
Glucose-Capillary: 153 mg/dL — ABNORMAL HIGH (ref 70–99)
Glucose-Capillary: 168 mg/dL — ABNORMAL HIGH (ref 70–99)
Glucose-Capillary: 191 mg/dL — ABNORMAL HIGH (ref 70–99)

## 2013-06-19 LAB — CBC
HCT: 29.9 % — ABNORMAL LOW (ref 39.0–52.0)
Hemoglobin: 9.5 g/dL — ABNORMAL LOW (ref 13.0–17.0)
MCH: 32.1 pg (ref 26.0–34.0)
MCHC: 31.8 g/dL (ref 30.0–36.0)
MCV: 101 fL — AB (ref 78.0–100.0)
PLATELETS: 126 10*3/uL — AB (ref 150–400)
RBC: 2.96 MIL/uL — ABNORMAL LOW (ref 4.22–5.81)
RDW: 15.4 % (ref 11.5–15.5)
WBC: 9.8 10*3/uL (ref 4.0–10.5)

## 2013-06-19 LAB — BASIC METABOLIC PANEL
BUN: 18 mg/dL (ref 6–23)
CALCIUM: 9.1 mg/dL (ref 8.4–10.5)
CO2: 41 mEq/L (ref 19–32)
Chloride: 94 mEq/L — ABNORMAL LOW (ref 96–112)
Creatinine, Ser: 0.44 mg/dL — ABNORMAL LOW (ref 0.50–1.35)
GFR calc Af Amer: >90 mL/min (ref >90)
GFR calc non Af Amer: >90 mL/min (ref >90)
Glucose, Bld: 190 mg/dL — ABNORMAL HIGH (ref 70–99)
Potassium: 5 mEq/L (ref 3.7–5.3)
Sodium: 139 mEq/L (ref 137–147)

## 2013-06-19 MED ORDER — FUROSEMIDE 10 MG/ML IJ SOLN
20.0000 mg | Freq: Once | INTRAMUSCULAR | Status: AC
  Administered 2013-06-19: 20 mg via INTRAVENOUS
  Filled 2013-06-19: qty 2

## 2013-06-19 NOTE — Progress Notes (Signed)
Difficult to perform mouth care d/t pt biting and agitation.

## 2013-06-19 NOTE — Progress Notes (Signed)
CRITICAL VALUE ALERT  Critical value received:  Co2 41 on BMET  Date of notification:  06/19/2013  Time of notification:  0509  Critical value read back:yes  Nurse who received alert:  Tory EmeraldMichelle Katalyn Matin, RN  MD notified (1st page):  Dr Deterding   Time of first page:  0509 (called Pola CornElink)  MD notified (2nd page):  Time of second page:  Responding MD:  Dr Darrick Pennaeterding  Time MD responded:  [phone]

## 2013-06-19 NOTE — Progress Notes (Signed)
PULMONARY / CRITICAL CARE MEDICINE   Name: Thomas FermoDaniel F Conley MRN: [id number] DOB: [birth year]    ADMISSION DATE:  05/25/2013  REFERRING MD :  EDP PRIMARY SERVICE: PCCM  BRIEF PATIENT DESCRIPTION:  [age] M with cerebral palsy and severe kyphoscoliosis admitted via ED with severe respiratory distress likely due to PNA  SIGNIFICANT EVENTS / STUDIES:  3/20- slow improvement. Transferred to SDU 3/21 worsenend resp status. Transferred back to ICU. NPPV resumed 3/21 Echo: LVEF 65-70% 3/22 much worse, failed NPPV, intubated 3/22 FOB: moderate secretions suctioned 3/23 Episodic severe desaturations requiring bag ventilation and suctioning. 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 Heavy sedation continued 3/26 Reduce sedation to moderate level 3/26 Extended discussion re: possibility of trach tube and anticipated implications/consequences   LINES / TUBES: R IJ CVL 3/22 >>  ETT 3/22 >>   CULTURES: Blood 3/17 >> NEG RVP 3/19 >> parainfluenza virus Strep Ag 3/22 >> NEG resp cult 3/22 >> NOF  ANTIBIOTICS:  Vanc 3/17 >>>3/19, 3/21 >> 3/25 Aztreonam 3/18>> 3/25 Levofloxacin 3/17 >> 3/21, 3/25 >> 3/28  SUBJECTIVE:     VITAL SIGNS: Temp:  [97.5 F (36.4 C)-99 F (37.2 C)] 98.6 F (37 C) (03/28 0820) Pulse Rate:  [108-139] 117 (03/28 0915) Resp:  [8-19] 15 (03/28 0915) BP: (105-143)/(46-96) 129/64 mmHg (03/28 0915) SpO2:  [88 %-97 %] 94 % (03/28 0915) FiO2 (%):  [40 %] 40 % (03/28 0847)   VENTILATOR SETTINGS: Vent Mode:  [-] PCV FiO2 (%):  [40 %] 40 % Set Rate:  [14 bmp-16 bmp] 16 bmp PEEP:  [5 cmH20] 5 cmH20 Plateau Pressure:  [25 cmH20] 25 cmH20  INTAKE / OUTPUT: Intake/Output     03/27 0701 - 03/28 0700 03/28 0701 - 03/29 0700   I.V. (mL/kg) 651.5 (13) 31 (0.6)   Other 676 52   IV Piggyback 159    Total Intake(mL/kg) 1486.5 (29.6) 83 (1.7)   Urine (mL/kg/hr) 660 (0.5) 60  (0.3)   Stool 1 (0)    Total Output 661 60   Net +825.5 +23          PHYSICAL EXAMINATION: General: Severely kyphoscoliotic, sedated Neuro: awake, responding to parents HEENT: NSC Neck: JVP not elevated Cardiovascular: tachy, regular, no M, tr gen edema .  Lungs: scattered wheezes Abdomen: G tube, soft, diminished BS Ext: contractures, muscle atrophy, symmetric brawny pedal edema  LABS:  Recent Labs Lab 06/15/13 0400 06/17/13 0413 06/19/13 0404  HGB 10.0* 9.2* 9.5*  HCT 31.2* 29.7* 29.9*  WBC 8.3 9.6 9.8  PLT 66* 90* 126*     Recent Labs Lab 06/14/13 0414 06/15/13 0400 06/17/13 0413 06/19/13 0404  NA 139 139 140 139  K 3.3* 4.1 4.9 5.0  CL 90* 93* 95* 94*  CO2 >45* 44* 43* 41*  GLUCOSE 202* 295* 224* 190*  BUN 13 12 15 18   CREATININE 0.44* 0.50 0.53 0.44*  CALCIUM 8.7 8.8 8.8 9.1     CXR: Persistent bibasilar atelectasis or pneumonia.   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   Mitochondrial myopathy   GERD (gastroesophageal reflux disease)   Severe sepsis(995.92)   Scoliosis   PNA (pneumonia)   Acute respiratory failure with hypoxia   ARDS (adult respiratory distress syndrome)   PULMONARY A: Acute resp failure ARDS, resolving Parainfluenza virus bronchopneumonia P:   Vent  settings reviewed and adjusted Cont vent bundle Daily SBT if/when indicated Would wean slowly in PCV mode over WE (tolerates better than PSV) Will decide on trial of extubation vs trach tube 3/30  CARDIOVASCULAR A: Sinus tachycardia - reactive Hypertension - resolved   P:  Monitor BP and rhythm Low dose PRN metoprolol to maintain HR < 130/min Cont scheduled metoprolol Lasix 20mg  IV x 1   RENAL A: Hypokalemia, resolved P:   Monitor BMET intermittently Monitor I/Os Correct electrolytes as indicated  GASTROINTESTINAL A: Chronic dysphagia H/O  Nissen fundoplication Chronic G tube P:   SUP: enteral PPI Cont TFs  HEMATOLOGIC A:  Thrombocytopenia - Improving P:  DVT px: SCDs (SQ hep stopped 3/26 due to mild epistaxis) Monitor CBC intermittently Transfuse per usual ICU guidelines Minimize phlebotomy  INFECTIOUS A:  Severe sepsis, resolving Presumed PNA, radiographically improved Parainfluenza virus bronchopneumonia P:   Micro and abx as above Completed full course of abx, afebrile/nml wbc-d/c levaquin   ENDOCRINE A:  IDDM P:   Cont SSI Increased Lantus 3/27  NEUROLOGIC A: Cerebral palsy Chronic muscle contractures Severe baseline cognitive impairment Baseline hearing impairment Agitation Seizure d/o Ventilator dyssynchrony P:   Wean sedation RAAS 0 as tolerated.  Cont AEDs     TODAY'S SUMMARY:   I have personally obtained a history, examined the patient, evaluated laboratory and imaging results, formulated the assessment and plan and placed orders.  CRITICAL CARE: 35 mins   Tammy Parrett NP-C  [facility]  [phone]   Attending:  I have seen and examined the patient with nurse practitioner/resident and agree with the note above.   Dannielle Huh is weaning this afternoon.  We are attempting PSV 12/5 and he is doing OK.  Goal will be PSV for about 2 hours.  Discussed trach again with family.  In general they don't want it but understand it may be necessary.  Explained risk of re-intubation and concern for difficult airway.   CC time 35 minutes   Yolonda Kida PCCM Pager: [phone] Cell: [phone] If no response, call [phone]

## 2013-06-20 DIAGNOSIS — T888XXA Other specified complications of surgical and medical care, not elsewhere classified, initial encounter: Secondary | ICD-10-CM

## 2013-06-20 LAB — BASIC METABOLIC PANEL
BUN: 21 mg/dL (ref 6–23)
CALCIUM: 9 mg/dL (ref 8.4–10.5)
CO2: 43 mEq/L (ref 19–32)
Chloride: 95 mEq/L — ABNORMAL LOW (ref 96–112)
Creatinine, Ser: 0.45 mg/dL — ABNORMAL LOW (ref 0.50–1.35)
Glucose, Bld: 128 mg/dL — ABNORMAL HIGH (ref 70–99)
Potassium: 4.6 mEq/L (ref 3.7–5.3)
SODIUM: 140 meq/L (ref 137–147)

## 2013-06-20 LAB — GLUCOSE, CAPILLARY
Glucose-Capillary: 102 mg/dL — ABNORMAL HIGH (ref 70–99)
Glucose-Capillary: 127 mg/dL — ABNORMAL HIGH (ref 70–99)
Glucose-Capillary: 143 mg/dL — ABNORMAL HIGH (ref 70–99)
Glucose-Capillary: 147 mg/dL — ABNORMAL HIGH (ref 70–99)
Glucose-Capillary: 88 mg/dL (ref 70–99)
Glucose-Capillary: 92 mg/dL (ref 70–99)

## 2013-06-20 MED ORDER — POLYETHYLENE GLYCOL 3350 17 G PO PACK
17.0000 g | PACK | Freq: Every day | ORAL | Status: DC
  Administered 2013-06-20 – 2013-06-24 (×5): 17 g via ORAL
  Filled 2013-06-20 (×6): qty 1

## 2013-06-20 MED ORDER — SENNOSIDES 8.8 MG/5ML PO SYRP
5.0000 mL | ORAL_SOLUTION | Freq: Every day | ORAL | Status: DC | PRN
  Administered 2013-06-20 – 2013-06-21 (×2): 5 mL via ORAL
  Filled 2013-06-20 (×2): qty 5

## 2013-06-20 MED ORDER — FUROSEMIDE 10 MG/ML IJ SOLN
20.0000 mg | Freq: Once | INTRAMUSCULAR | Status: AC
  Administered 2013-06-20: 20 mg via INTRAVENOUS
  Filled 2013-06-20: qty 2

## 2013-06-20 NOTE — Progress Notes (Signed)
PULMONARY / CRITICAL CARE MEDICINE   Name: Thomas FermoDaniel F Conley MRN: [id number] DOB: 1985/11/10    ADMISSION DATE:  05/28/2013  REFERRING MD :  EDP PRIMARY SERVICE: PCCM  BRIEF PATIENT DESCRIPTION:  [age] M with cerebral palsy and severe kyphoscoliosis admitted via ED with severe respiratory distress likely due to PNA  SIGNIFICANT EVENTS / STUDIES:  3/20- slow improvement. Transferred to SDU 3/21 worsenend resp status. Transferred back to ICU. NPPV resumed 3/21 Echo: LVEF 65-70% 3/22 much worse, failed NPPV, intubated 3/22 FOB: moderate secretions suctioned 3/23 Episodic severe desaturations requiring bag ventilation and suctioning. 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 Heavy sedation continued 3/26 Reduce sedation to moderate level 3/26 Extended discussion re: possibility of trach tube and anticipated implications/consequences   LINES / TUBES: R IJ CVL 3/22 >>  ETT 3/22 >>   CULTURES: Blood 3/17 >> NEG RVP 3/19 >> parainfluenza virus Strep Ag 3/22 >> NEG resp cult 3/22 >> NOF  ANTIBIOTICS:  Vanc 3/17 >>>3/19, 3/21 >> 3/25 Aztreonam 3/18>> 3/25 Levofloxacin 3/17 >> 3/21, 3/25 >> 3/28  SUBJECTIVE:   Weaned x 3 hr this am , placed back on full support after increased WOB     VITAL SIGNS: Temp:  [97.5 F (36.4 C)-98.7 F (37.1 C)] 98.4 F (36.9 C) (03/29 0833) Pulse Rate:  [111-135] 133 (03/29 1032) Resp:  [11-22] 22 (03/29 1032) BP: (90-124)/(45-81) 109/75 mmHg (03/29 1032) SpO2:  [92 %-96 %] 95 % (03/29 1032) FiO2 (%):  [40 %] 40 % (03/29 1144)   VENTILATOR SETTINGS: Vent Mode:  [-] PCV FiO2 (%):  [40 %] 40 % Set Rate:  [16 bmp] 16 bmp PEEP:  [5 cmH20] 5 cmH20 Pressure Support:  [12 cmH20-16 cmH20] 16 cmH20 Plateau Pressure:  [29 cmH20] 29 cmH20  INTAKE / OUTPUT: Intake/Output     03/28 0701 - 03/29 0700 03/29 0701 - 03/30 0700   I.V. (mL/kg) 634.5 (12.6) 40  (0.8)   Other 936    IV Piggyback 156    Total Intake(mL/kg) 1726.5 (34.3) 40 (0.8)   Urine (mL/kg/hr) 1135 (0.9) 75 (0.3)   Stool     Total Output 1135 75   Net +591.5 -35          PHYSICAL EXAMINATION: General: Severely kyphoscoliotic, sedated Neuro: awake, responding to parents HEENT: NSC Neck: JVP not elevated Cardiovascular: tachy, regular, no M, tr gen edema .  Lungs: scattered wheezes Abdomen: G tube, soft, diminished BS Ext: contractures, muscle atrophy, symmetric brawny pedal edema  LABS:  Recent Labs Lab 06/15/13 0400 06/17/13 0413 06/19/13 0404  HGB 10.0* 9.2* 9.5*  HCT 31.2* 29.7* 29.9*  WBC 8.3 9.6 9.8  PLT 66* 90* 126*     Recent Labs Lab 06/14/13 0414 06/15/13 0400 06/17/13 0413 06/19/13 0404 06/20/13 0415  NA 139 139 140 139 140  K 3.3* 4.1 4.9 5.0 4.6  CL 90* 93* 95* 94* 95*  CO2 >45* 44* 43* 41* 43*  GLUCOSE 202* 295* 224* 190* 128*  BUN 13 12 15 18 21   CREATININE 0.44* 0.50 0.53 0.44* 0.45*  CALCIUM 8.7 8.8 8.8 9.1 9.0     CXR: Persistent bibasilar atelectasis or pneumonia 3/28   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   Mitochondrial myopathy   GERD (gastroesophageal reflux disease)   Severe sepsis(995.92)   Scoliosis  PNA (pneumonia)   Acute respiratory failure with hypoxia   ARDS (adult respiratory distress syndrome)   PULMONARY A: Acute resp failure ARDS, resolving Parainfluenza virus bronchopneumonia  P:   Cont vent bundle Would wean slowly in PCV mode over WE (tolerates better than PSV) Will decide on trial of extubation vs trach tube 3/30 Check cxr in am   CARDIOVASCULAR A: Sinus tachycardia - reactive Hypertension - resolved 3/29 >remains pos bal 1.5 L   P:  Monitor BP and rhythm Low dose PRN metoprolol to maintain HR < 130/min Cont scheduled metoprolol Lasix 20mg  IV x 1      RENAL A: Hypokalemia, resolved P:   Monitor BMET intermittently Monitor I/Os Correct electrolytes as indicated  GASTROINTESTINAL A: Chronic dysphagia H/O Nissen fundoplication Chronic G tube P:   SUP: enteral PPI Cont TFs  HEMATOLOGIC A:  Thrombocytopenia - Improving P:  DVT px: SCDs (SQ hep stopped 3/26 due to mild epistaxis) Monitor CBC intermittently Transfuse per usual ICU guidelines Minimize phlebotomy  INFECTIOUS A:  Severe sepsis, resolving Presumed PNA, radiographically improved Parainfluenza virus bronchopneumonia P:   Micro and abx as above Completed full course of abx,    ENDOCRINE A:  IDDM P:   Cont SSI Increased Lantus 3/27  NEUROLOGIC A: Cerebral palsy Chronic muscle contractures Severe baseline cognitive impairment Baseline hearing impairment Agitation Seizure d/o Ventilator dyssynchrony P:   Wean sedation RAAS 0 as tolerated.  Cont AEDs     TODAY'S SUMMARY:   I have personally obtained a history, examined the patient, evaluated laboratory and imaging results, formulated the assessment and plan and placed orders.  CRITICAL CARE: 35 mins   Thomas Parrett NP-C  [facility]  [phone]   Attending:  I have seen and examined the patient with nurse practitioner/resident and agree with the note above.   Thomas Conley weaned for 3 hours today More awake Still constipated Volume up  Diuresis Wean tomorrow Will need to consider trach vs extubation this week (would be high risk given difficult airway) Increase bowel regimen CXR in AM  Family updated  CC time 30 minutes  Thomas Conley PCCM Pager: [phone] Cell: [phone] If no response, call [phone]

## 2013-06-21 ENCOUNTER — Inpatient Hospital Stay (HOSPITAL_COMMUNITY): Payer: Managed Care, Other (non HMO)

## 2013-06-21 ENCOUNTER — Encounter (HOSPITAL_COMMUNITY): Payer: Self-pay

## 2013-06-21 LAB — GLUCOSE, CAPILLARY
GLUCOSE-CAPILLARY: 152 mg/dL — AB (ref 70–99)
GLUCOSE-CAPILLARY: 158 mg/dL — AB (ref 70–99)
GLUCOSE-CAPILLARY: 199 mg/dL — AB (ref 70–99)
Glucose-Capillary: 147 mg/dL — ABNORMAL HIGH (ref 70–99)
Glucose-Capillary: 208 mg/dL — ABNORMAL HIGH (ref 70–99)
Glucose-Capillary: 135 mg/dL — ABNORMAL HIGH (ref 70–99)

## 2013-06-21 LAB — BASIC METABOLIC PANEL
BUN: 23 mg/dL (ref 6–23)
CHLORIDE: 95 meq/L — AB (ref 96–112)
CO2: 43 mEq/L (ref 19–32)
Calcium: 9 mg/dL (ref 8.4–10.5)
Creatinine, Ser: 0.44 mg/dL — ABNORMAL LOW (ref 0.50–1.35)
GFR calc Af Amer: >90 mL/min (ref >90)
Glucose, Bld: 157 mg/dL — ABNORMAL HIGH (ref 70–99)
POTASSIUM: 4.3 meq/L (ref 3.7–5.3)
SODIUM: 142 meq/L (ref 137–147)

## 2013-06-21 LAB — PRO B NATRIURETIC PEPTIDE: PRO B NATRI PEPTIDE: 576.3 pg/mL — AB (ref 0–125)

## 2013-06-21 MED ORDER — FUROSEMIDE 10 MG/ML IJ SOLN
INTRAMUSCULAR | Status: AC
  Administered 2013-06-21: 20 mg
  Filled 2013-06-21: qty 2

## 2013-06-21 MED ORDER — METOPROLOL TARTRATE 1 MG/ML IV SOLN
2.5000 mg | INTRAVENOUS | Status: DC | PRN
  Administered 2013-06-23 – 2013-06-24 (×5): 5 mg via INTRAVENOUS
  Filled 2013-06-21 (×6): qty 5

## 2013-06-21 MED ORDER — FENTANYL CITRATE 0.05 MG/ML IJ SOLN
12.5000 ug | INTRAMUSCULAR | Status: DC | PRN
  Administered 2013-06-21 – 2013-06-24 (×16): 25 ug via INTRAVENOUS
  Filled 2013-06-21 (×16): qty 2

## 2013-06-21 MED ORDER — LORAZEPAM 2 MG/ML IJ SOLN
1.0000 mg | INTRAMUSCULAR | Status: DC | PRN
  Administered 2013-06-21 – 2013-06-24 (×9): 1 mg via INTRAVENOUS
  Filled 2013-06-21 (×9): qty 1

## 2013-06-21 MED ORDER — FUROSEMIDE 10 MG/ML IJ SOLN: 20.0000 mg | Freq: Once | INTRAMUSCULAR | Status: DC

## 2013-06-21 NOTE — Progress Notes (Signed)
PULMONARY / CRITICAL CARE MEDICINE   Name: Thomas Conley MRN: [id number] DOB: 07/18/1985    ADMISSION DATE:  05/30/2013  REFERRING MD :  EDP PRIMARY SERVICE: PCCM  BRIEF PATIENT DESCRIPTION:  77 M with cerebral palsy and severe kyphoscoliosis admitted via ED with severe respiratory distress likely due to PNA  SIGNIFICANT EVENTS / STUDIES:  3/20- slow improvement. Transferred to SDU 3/21 worsenend resp status. Transferred back to ICU. NPPV resumed 3/21 Echo: LVEF 65-70% 3/22 much worse, failed NPPV, intubated 3/22 FOB: moderate secretions suctioned 3/23 Episodic severe desaturations requiring bag ventilation and suctioning. 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 Heavy sedation continued 3/26 Reduce sedation to moderate level 3/26 Extended discussion re: possibility of trach tube and anticipated implications/consequences  3/28 - 3/29 tolerating decreasing Pi in PCV mode. Tapering sedation 3/30 Passed SBT. Extubated. Problems with airway secretions post extubation. Improved with NTS. PRN BiPAP ordered  LINES / TUBES: R IJ CVL 3/22 >>  ETT 3/22 >> 3/30  CULTURES: Blood 3/17 >> NEG RVP 3/19 >> parainfluenza virus Strep Ag 3/22 >> NEG resp cult 3/22 >> NOF  ANTIBIOTICS:  Vanc 3/17 >>>3/19, 3/21 >> 3/25 Aztreonam 3/18>> 3/25 Levofloxacin 3/17 >> 3/21, 3/25 >> 3/28  SUBJECTIVE:  Passed SBT. Extubated. Problems with airway secretions post extubation. Improved with NTS. PRN BiPAP ordered    VITAL SIGNS: Temp:  [97.4 F (36.3 C)-99 F (37.2 C)] 98.7 F (37.1 C) (03/30 1600) Pulse Rate:  [99-136] 134 (03/30 1600) Resp:  [12-35] 17 (03/30 1600) BP: (99-136)/(45-86) 136/74 mmHg (03/30 1600) SpO2:  [89 %-98 %] 96 % (03/30 1600) FiO2 (%):  [40 %] 40 % (03/30 1143) Weight:  [57 kg (125 lb 10.6 oz)] 57 kg (125 lb 10.6 oz) (03/30 0600)   VENTILATOR SETTINGS: Vent Mode:  [-]  PCV FiO2 (%):  [40 %] 40 % Set Rate:  [16 bmp] 16 bmp PEEP:  [5 cmH20] 5 cmH20 Pressure Support:  [15 cmH20] 15 cmH20 Plateau Pressure:  [19 cmH20-25 cmH20] 19 cmH20  INTAKE / OUTPUT: Intake/Output     03/29 0701 - 03/30 0700 03/30 0701 - 03/31 0700   I.V. (mL/kg) 480 (8.4) 180 (3.2)   Other 1092 208   IV Piggyback 159    Total Intake(mL/kg) 1731 (30.4) 388 (6.8)   Urine (mL/kg/hr) 1145 (0.8) 745 (1.3)   Total Output 1145 745   Net +586 -357          PHYSICAL EXAMINATION: General: Severely kyphoscoliotic, no distress on PS 5 cm H2O Neuro: awake, responding to parents, MAEs HEENT: NSC Cardiovascular: tachy, regular, no M, tr gen edema .  Lungs: clear anteriorly Abdomen: G tube, soft, diminished BS Ext: contractures, muscle atrophy, symmetric BUE and BLE edema  LABS:  I have reviewed all of today's lab results. 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  NTS PRN BiPAP PRN Reintubation if fails - note difficult airway Trach tube if fails  extubation  CARDIOVASCULAR A: Sinus tachycardia - reactive Hypertension - resolved P:  Monitor BP and rhythm Low dose PRN metoprolol to maintain HR < 130/min Cont scheduled metoprolol     RENAL A: Hypokalemia, resolved Hypervolemia P:   Monitor BMET intermittently Monitor I/Os Correct electrolytes as indicated Lasix X 1 3/30  GASTROINTESTINAL A: Chronic dysphagia H/O Nissen fundoplication Chronic G tube P:   SUP:  enteral PPI Cont TFs  HEMATOLOGIC A:  Thrombocytopenia - Improving P:  DVT px: SCDs Monitor CBC intermittently Transfuse per usual ICU guidelines Minimize phlebotomy  INFECTIOUS A:  Severe sepsis, resolved Presumed PNA, resolved Parainfluenza virus bronchopneumonia, resolved P:   Micro and abx as above  ENDOCRINE A:  IDDM P:   Cont Lantus/SSI   NEUROLOGIC A: Cerebral palsy Chronic muscle contractures Severe baseline cognitive impairment Baseline hearing impairment Agitation Seizure d/o Ventilator dyssynchrony P:    Cont AEDs Low dose fentanyl PRN     TODAY'S SUMMARY:   I have personally obtained a history, examined the patient, evaluated laboratory and imaging results, formulated the assessment and plan and placed orders.  CRITICAL CARE: 45 mins  Billy Fischeravid Shifra Swartzentruber, MD ; [facility] service Mobile [phone].  After 5:30 PM or weekends, call [phone]

## 2013-06-21 NOTE — Progress Notes (Signed)
Versed 30 ml and fentanyl 100ml wasted in sink. Manon Hildingachel Fountain RN witnessed

## 2013-06-21 NOTE — Procedures (Signed)
Extubation Procedure Note  Patient Details:   Name: Thomas Conley DOB: June 24, 1985 MRN: [id number]   Airway Documentation:     Evaluation  O2 sats: stable throughout Complications: No apparent complications Patient did tolerate procedure well. Bilateral Breath Sounds: Clear;Diminished Suctioning: Airway Yes  Ave Filterdkins, Zanyiah Posten Williams 06/21/2013, 12:38 PM

## 2013-06-21 NOTE — Progress Notes (Signed)
eLink Physician-Brief Progress Note Patient Name: Thomas FermoDaniel F Conley DOB: 1985/08/10 MRN: [id number]  Date of Service  06/21/2013   HPI/Events of Note   RN says patient repositione d a bit and then turned blue and needed to be bagged and placed on original venst settings and is now normal  eICU Interventions  Continue current settings Check cxr   Intervention Category Major Interventions: Respiratory failure - evaluation and management  Kathee Tumlin 06/21/2013, 2:33 AM

## 2013-06-21 NOTE — Progress Notes (Signed)
At 0215 into reposition pt. Shifted pt to the right slightly. Pts oxygen sats had been 94-96% tonight. Pt slowly started to desaturate. HR normally 110-120's increased to 130-140's. Fentanyl bolus given. Suctioned a small amount of clear frothy sputum from ETT. Pt continued to have issues with oxygenation. O2 sats dropped to 50% on ventilator. RN bagged pt. RT called to bedside. After bagging pt he was reconnected to the ventilator and suctioned again but no sputum noted. RT at bedside. Pt looked to be bearing down and holding his breath. Pts continuous versed restarted.  RT made adjustments to vent settings and pts VS slowly returned back his normal. O2 94%, HR 117. Vent settings returned back to previous settings.  Elink MD made aware of events.  Order for stat chest xray and to call when chest xray complete.  Will continue to monitor.

## 2013-06-22 ENCOUNTER — Inpatient Hospital Stay (HOSPITAL_COMMUNITY): Payer: Managed Care, Other (non HMO)

## 2013-06-22 LAB — GLUCOSE, CAPILLARY
GLUCOSE-CAPILLARY: 160 mg/dL — AB (ref 70–99)
GLUCOSE-CAPILLARY: 53 mg/dL — AB (ref 70–99)
GLUCOSE-CAPILLARY: 80 mg/dL (ref 70–99)
GLUCOSE-CAPILLARY: 84 mg/dL (ref 70–99)
GLUCOSE-CAPILLARY: 96 mg/dL (ref 70–99)
Glucose-Capillary: 144 mg/dL — ABNORMAL HIGH (ref 70–99)
Glucose-Capillary: 222 mg/dL — ABNORMAL HIGH (ref 70–99)

## 2013-06-22 MED ORDER — POTASSIUM CHLORIDE 20 MEQ/15ML (10%) PO LIQD
20.0000 meq | Freq: Once | ORAL | Status: AC
  Administered 2013-06-22: 20 meq
  Filled 2013-06-22: qty 15

## 2013-06-22 MED ORDER — DEXTROSE 50 % IV SOLN
INTRAVENOUS | Status: AC
  Administered 2013-06-22: 50 mL
  Filled 2013-06-22: qty 50

## 2013-06-22 MED ORDER — FLEET PEDIATRIC 3.5-9.5 GM/59ML RE ENEM
1.0000 | ENEMA | Freq: Once | RECTAL | Status: DC
  Filled 2013-06-22: qty 1

## 2013-06-22 MED ORDER — DEXTROSE 50 % IV SOLN
25.0000 mL | Freq: Once | INTRAVENOUS | Status: AC
  Administered 2013-06-22: 25 mL via INTRAVENOUS

## 2013-06-22 MED ORDER — DIVALPROEX SODIUM 125 MG PO CPSP: 250.0000 mg | ORAL_CAPSULE | Freq: Three times a day (TID) | ORAL | Status: DC

## 2013-06-22 MED ORDER — IBUPROFEN 100 MG/5ML PO SUSP
400.0000 mg | Freq: Three times a day (TID) | ORAL | Status: DC | PRN
  Administered 2013-06-22 – 2013-06-24 (×5): 400 mg via ORAL
  Filled 2013-06-22 (×8): qty 20

## 2013-06-22 MED ORDER — FUROSEMIDE 10 MG/ML IJ SOLN
20.0000 mg | Freq: Once | INTRAMUSCULAR | Status: AC
  Administered 2013-06-22: 20 mg via INTRAVENOUS
  Filled 2013-06-22: qty 2

## 2013-06-22 MED ORDER — FLEET ENEMA 7-19 GM/118ML RE ENEM
1.0000 | ENEMA | Freq: Once | RECTAL | Status: DC
  Filled 2013-06-22: qty 1

## 2013-06-22 MED ORDER — DIVALPROEX SODIUM 125 MG PO CPSP
375.0000 mg | ORAL_CAPSULE | Freq: Two times a day (BID) | ORAL | Status: DC
  Administered 2013-06-22 – 2013-06-24 (×4): 375 mg via ORAL
  Filled 2013-06-22 (×5): qty 3

## 2013-06-22 MED ORDER — FUROSEMIDE 10 MG/ML IJ SOLN
20.0000 mg | Freq: Once | INTRAMUSCULAR | Status: AC
  Administered 2013-06-22: 20 mg via INTRAVENOUS
  Filled 2013-06-22 (×2): qty 2

## 2013-06-22 MED ORDER — SENNOSIDES 8.8 MG/5ML PO SYRP
5.0000 mL | ORAL_SOLUTION | Freq: Every day | ORAL | Status: DC | PRN
  Filled 2013-06-22: qty 5

## 2013-06-22 MED ORDER — DIVALPROEX SODIUM 125 MG PO CPSP
250.0000 mg | ORAL_CAPSULE | Freq: Every day | ORAL | Status: DC
  Administered 2013-06-22 – 2013-06-24 (×3): 250 mg via ORAL
  Filled 2013-06-22 (×3): qty 2

## 2013-06-22 NOTE — Progress Notes (Signed)
RT NT suction patient x2. Moderate tan secretions. Patient tolerated well. No complications. Vital signs stable at this time. RT attempted to titrate Pt FIO2 to 55% Venti mask. Patient did not tolerate well. Placed back on 100%NRB.  Vitals stable at this time. RT will continue to monitor.

## 2013-06-22 NOTE — Progress Notes (Signed)
Pt O2 sat on PRB=99%.  Pt still has inc WOB (no change in last 36H).  Changes in breath sounds noted (from coarse crackles to rhonchi bilat - more in the right lung).  Edema is still non-pitting on bilat upper and lower.  Elink called.  CCNP to come assess.  Will continue to watch.

## 2013-06-22 NOTE — Progress Notes (Signed)
PULMONARY / CRITICAL CARE MEDICINE   Name: Thomas Conley MRN: [id number] DOB: 01-Jul-1985    ADMISSION DATE:  06/07/2013  REFERRING MD :  EDP PRIMARY SERVICE: PCCM  BRIEF PATIENT DESCRIPTION:  65 M with cerebral palsy and severe kyphoscoliosis admitted via ED with severe respiratory distress likely due to PNA  SIGNIFICANT EVENTS / STUDIES:  3/20- slow improvement. Transferred to SDU 3/21 worsenend resp status. Transferred back to ICU. NPPV resumed 3/21 Echo: LVEF 65-70% 3/22 much worse, failed NPPV, intubated 3/22 FOB: moderate secretions suctioned 3/23 Episodic severe desaturations requiring bag ventilation and suctioning. 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 Heavy sedation continued 3/26 Reduce sedation to moderate level 3/26 Extended discussion re: possibility of trach tube and anticipated implications/consequences  3/28 - 3/29 tolerating decreasing Pi in PCV mode. Tapering sedation 3/30 Passed SBT. Extubated. Problems with airway secretions post extubation. Improved with NTS. PRN BiPAP ordered 3/31 Has not required BiPAP since extubation.  Mild to mod increase in WOB. SpO2 100% on NRB. Agitation well controlled with low dose fentanyl.   LINES / TUBES: ETT 3/22 >> 3/30 R IJ CVL 3/22 >>   CULTURES: Blood 3/17 >> NEG RVP 3/19 >> parainfluenza virus Strep Ag 3/22 >> NEG resp cult 3/22 >> NOF  ANTIBIOTICS:  Vanc 3/17 >>>3/19, 3/21 >> 3/25 Aztreonam 3/18>> 3/25 Levofloxacin 3/17 >> 3/21, 3/25 >> 3/28  SUBJECTIVE:  3/31 Mild to mod increase in WOB. SpO2 100% on NRB. Agitation well controlled with low dose fentanyl.    VITAL SIGNS: Temp:  [97.5 F (36.4 C)-98.7 F (37.1 C)] 98.6 F (37 C) (03/31 0800) Pulse Rate:  [94-141] 114 (03/31 1200) Resp:  [17-32] 24 (03/31 1200) BP: (85-142)/(55-89) 116/56 mmHg (03/31 1200) SpO2:  [91 %-100 %] 100 % (03/31 1200) FiO2  (%):  [100 %] 100 % (03/31 1000)   VENTILATOR SETTINGS: Vent Mode:  [-]  FiO2 (%):  [100 %] 100 %  INTAKE / OUTPUT: Intake/Output     03/30 0701 - 03/31 0700 03/31 0701 - 04/01 0700   I.V. (mL/kg) 480 (8.4) 60 (1.1)   Other 1093 156   NG/GT 30 30   IV Piggyback 106    Total Intake(mL/kg) 1709 (30) 246 (4.3)   Urine (mL/kg/hr) 745 (0.5)    Total Output 745     Net +964 +246        Urine Occurrence 3 x    Stool Occurrence 1 x      PHYSICAL EXAMINATION: General: Severely kyphoscoliotic, mild to mod increase in WOB Neuro: awake, responding to parents, MAEs HEENT: NSC Cardiovascular: tachy, regular, no M, tr gen edema .  Lungs: Slightly coarse without wheezes Abdomen: G tube, soft, diminished BS Ext: contractures, muscle atrophy, extremity edema improved  LABS:  I have reviewed all of today's lab results. Relevant abnormalities are discussed in the A/P section  CXR: NSC   ASSESSMENT / PLAN:  PULMONARY A: Acute resp failure ARDS, resolved Resp compromise due to severe thoracic deformity and marginal cough mechanics P:   Cont to monitor closely in ICU Cont supplemental O2 titrating to maintain SpO2 > 90 % Cont NTS PRN Cont BiPAP PRN Reintubation if fails - note difficult airway Trach tube if fails extubation  CARDIOVASCULAR A: Sinus tachycardia - reactive. Improved Hypertension - resolved P:  Monitor BP and rhythm Low dose PRN metoprolol to maintain HR < 130/min Cont scheduled metoprolol  RENAL  A: Hypokalemia, resolved Hypervolemia P:   Monitor BMET intermittently Monitor I/Os Correct electrolytes as indicated Lasix X 1 3/31 KCl X 1 3/31  GASTROINTESTINAL A: Chronic dysphagia H/O Nissen fundoplication Chronic G tube P:   SUP: enteral PPI Cont TFs  HEMATOLOGIC A:  Thrombocytopenia - Improving P:  DVT px: SCDs Monitor CBC intermittently Transfuse per usual ICU guidelines Minimizing phlebotomy  INFECTIOUS A:  Severe sepsis,  resolved Presumed PNA, resolved Parainfluenza virus bronchopneumonia, resolved P:   Micro and abx as above  ENDOCRINE A:  IDDM P:   Cont Lantus/SSI   NEUROLOGIC A: Cerebral palsy Chronic muscle contractures Severe baseline cognitive impairment Baseline hearing impairment Agitation Seizure d/o Ventilator dyssynchrony P:    Cont AEDs Cont low dose fentanyl PRN       I have personally obtained a history, examined the patient, evaluated laboratory and imaging results, formulated the assessment and plan and placed orders.   Billy Fischeravid Simonds, MD ; [facility] Mobile [phone].  After 5:30 PM or weekends, call [phone]

## 2013-06-22 NOTE — Progress Notes (Signed)
Pt has increased WOB with Rhonchi and diminished BBS. Attempted to pass 8 Fr suction catheter x2 in each nostril without success. Pt sats of 94% on NRB. RT will continue to monitor.

## 2013-06-22 NOTE — Progress Notes (Signed)
S:  Called to bedside to assess patients respiratory status  O:   115/66, 111, 23, 99% on Partial Rebreather  Exam: General: chronically ill  Neuro: mild lethargy, difficult to assess neuro status (parents indicate he continues to communicate with them via their 'sign language' CV: s1s2 rrr, no m/r/g, no jvd PULM: mild accessory muscle use, lungs bilaterally with rhonchi, wheezing on R GI: round/soft, bsx4 hypoactive Extremities: warm/dry, 1-2+ pitting edema in ext & noted dependent swelling   Recent Labs Lab 06/17/13 0413 06/19/13 0404 06/20/13 0415 06/21/13 0400  NA 140 139 140 142  K 4.9 5.0 4.6 4.3  CL 95* 94* 95* 95*  CO2 43* 41* 43* 43*  GLUCOSE 224* 190* 128* 157*  BUN 15 18 21 23   CREATININE 0.53 0.44* 0.45* 0.44*  CALCIUM 8.8 9.1 9.0 9.0     A: Respiratory Failure - serum CO2 on BMP unchanged ARDS 2/2 Parainfluenza Virus Restrictive Disease - in setting of thoracic deformitiy Hypervolemia   P: -continue to monitor respiratory status for now -administer PRN xopenex now -repeat lasix 20 mg now -mobilize as able, up to chair -monitor closely for hemodynamic changes, discussed change from tachy to brady, worsening resp effort, increased lethargy etc with RN -pediatric airway needs at bedside -follow BMP CO2 trend      Thomas BrimBrandi Candiss Galeana, NP-C [facility] Pgr: [phone] or [phone]

## 2013-06-23 LAB — BASIC METABOLIC PANEL
BUN: 16 mg/dL (ref 6–23)
CO2: >45 mEq/L (ref 19–32)
CREATININE: 0.36 mg/dL — AB (ref 0.50–1.35)
Calcium: 9.1 mg/dL (ref 8.4–10.5)
Chloride: 92 mEq/L — ABNORMAL LOW (ref 96–112)
GFR calc non Af Amer: >90 mL/min (ref >90)
Glucose, Bld: 140 mg/dL — ABNORMAL HIGH (ref 70–99)
POTASSIUM: 4.4 meq/L (ref 3.7–5.3)
Sodium: 144 mEq/L (ref 137–147)

## 2013-06-23 LAB — GLUCOSE, CAPILLARY
GLUCOSE-CAPILLARY: 138 mg/dL — AB (ref 70–99)
Glucose-Capillary: 155 mg/dL — ABNORMAL HIGH (ref 70–99)
Glucose-Capillary: 165 mg/dL — ABNORMAL HIGH (ref 70–99)
Glucose-Capillary: 174 mg/dL — ABNORMAL HIGH (ref 70–99)
Glucose-Capillary: 176 mg/dL — ABNORMAL HIGH (ref 70–99)
Glucose-Capillary: 90 mg/dL (ref 70–99)
Glucose-Capillary: 98 mg/dL (ref 70–99)

## 2013-06-23 LAB — CBC
HEMATOCRIT: 30.1 % — AB (ref 39.0–52.0)
Hemoglobin: 9.1 g/dL — ABNORMAL LOW (ref 13.0–17.0)
MCH: 31 pg (ref 26.0–34.0)
MCHC: 30.2 g/dL (ref 30.0–36.0)
MCV: 102.4 fL — ABNORMAL HIGH (ref 78.0–100.0)
Platelets: 293 10*3/uL (ref 150–400)
RBC: 2.94 MIL/uL — ABNORMAL LOW (ref 4.22–5.81)
RDW: 14.9 % (ref 11.5–15.5)
WBC: 8.6 10*3/uL (ref 4.0–10.5)

## 2013-06-23 MED ORDER — ENOXAPARIN SODIUM 40 MG/0.4ML ~~LOC~~ SOLN
40.0000 mg | SUBCUTANEOUS | Status: DC
  Administered 2013-06-23: 40 mg via SUBCUTANEOUS
  Filled 2013-06-23 (×2): qty 0.4

## 2013-06-23 MED ORDER — INSULIN ASPART 100 UNIT/ML ~~LOC~~ SOLN
0.0000 [IU] | SUBCUTANEOUS | Status: DC
  Administered 2013-06-23: 2 [IU] via SUBCUTANEOUS
  Administered 2013-06-23: 4 [IU] via SUBCUTANEOUS
  Administered 2013-06-23: 3 [IU] via SUBCUTANEOUS
  Administered 2013-06-24: 5 [IU] via SUBCUTANEOUS
  Administered 2013-06-24 (×2): 3 [IU] via SUBCUTANEOUS
  Administered 2013-06-24: 2 [IU] via SUBCUTANEOUS

## 2013-06-23 MED ORDER — OXYMETAZOLINE HCL 0.05 % NA SOLN
1.0000 | Freq: Two times a day (BID) | NASAL | Status: DC | PRN
  Filled 2013-06-23: qty 15

## 2013-06-23 MED ORDER — LIDOCAINE VISCOUS 2 % MT SOLN
15.0000 mL | Freq: Once | OROMUCOSAL | Status: AC
  Administered 2013-06-23: 15 mL via OROMUCOSAL
  Filled 2013-06-23: qty 15

## 2013-06-23 MED ORDER — FUROSEMIDE 10 MG/ML IJ SOLN
20.0000 mg | Freq: Once | INTRAMUSCULAR | Status: AC
  Administered 2013-06-23: 20 mg via INTRAVENOUS
  Filled 2013-06-23: qty 2

## 2013-06-23 MED ORDER — INSULIN GLARGINE 100 UNIT/ML ~~LOC~~ SOLN
10.0000 [IU] | Freq: Every day | SUBCUTANEOUS | Status: DC
  Administered 2013-06-23: 10 [IU] via SUBCUTANEOUS
  Filled 2013-06-23 (×2): qty 0.1

## 2013-06-23 MED ORDER — FLUTICASONE PROPIONATE 50 MCG/ACT NA SUSP
2.0000 | Freq: Two times a day (BID) | NASAL | Status: DC
  Administered 2013-06-23 – 2013-06-24 (×3): 2 via NASAL
  Filled 2013-06-23: qty 16

## 2013-06-23 MED ORDER — OXYMETAZOLINE HCL 0.05 % NA SOLN
1.0000 | Freq: Two times a day (BID) | NASAL | Status: DC
  Administered 2013-06-23 – 2013-06-24 (×3): 1 via NASAL
  Filled 2013-06-23: qty 15

## 2013-06-23 NOTE — Progress Notes (Signed)
NUTRITION FOLLOW UP  Intervention:    Continue continuous regimen of Nutren 1.0 (pour 1,000 ml of Nutren 1.0 with fiber and 250 ml of Nutren 1.0 without fiber into feeding bag) run at 52 ml/h x 24 hours per day to provide 1250 kcals, 50 gm protein, 1050 ml free water daily.  Parents to supply Nutren 1.0 formula and RN to pour into a bag for feeding.  Nutrition Dx:   Inadequate oral intake related to chronic swallowing difficulty as evidenced by PEG in place for nutrition and NPO status. Ongoing.  Goal:   Intake to meet >90% of estimated nutrition needs. Met.  Monitor:   TF tolerance/adequacy, weight trend, labs, vent status.  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 He also has severe kyphoscoliosis and severe hearing impairment. He is cared for at home by his parents. He is followed by [facility] pediatrician, Dr Corinna Capra. He presented to [facility] ED on day of admission with a 3-4 day history of fever, cough and progressive dyspnea to the point of respiratory distress on the morning of admission. Patient has a G tube for nutrition.  During first part of hospital admission, patient was receiving his home bolus TF regimen. There was a concern for aspiration of bolus feedings, so feeding regimen was changed to continuous. Patient's mom and dad prefer to use home formula (Nutren 1.0) for feedings via PEG. Patient continues to receive Nutren 1.0 at 52 ml/h to provide 1250 kcals, 50 gm protein, 1050 ml free water daily.  Pt required intubation (3/22), however was extubated.  Now with some respiratory difficulty overnight.  RD met with pt and family who report tolerance of home formula.  Mom with concerns about continuous regimen.  Discussed implications for continuous feeds at this time.  Family agreeable.    Height: Ht Readings from Last 1 Encounters:  No data found for Ht    Weight Status:  stable Wt Readings from Last 1  Encounters:  06/21/13 125 lb 10.6 oz (57 kg)  05/23/2013  101 lb 10.1 oz (46.1 kg)  Re-estimated needs:  Kcal: 1250-1500  Protein: 50-60 gm  Fluid: 1.2-1.5 L  Skin: no wounds  Diet Order:  NPO   Intake/Output Summary (Last 24 hours) at 06/23/13 1153 Last data filed at 06/23/13 1100  Gross per 24 hour  Intake   1718 ml  Output      0 ml  Net   1718 ml    Last BM: 4/1   Labs:   Recent Labs Lab 06/20/13 0415 06/21/13 0400 06/23/13 0500  NA 140 142 144  K 4.6 4.3 4.4  CL 95* 95* 92*  CO2 43* 43* >45*  BUN _0 CREATININE 0.45* 0.44* 0.36*  CALCIUM 9.0 9.0 9.1  GLUCOSE 128* 157* 140*    CBG (last 3)   Recent Labs  06/22/13 2336 06/23/13 0347 06/23/13 0731  GLUCAP 90 98 174*    Scheduled Meds: . antiseptic oral rinse  15 mL Mouth Rinse QID  . budesonide  0.25 mg Nebulization Q6H  . chlorhexidine  15 mL Mouth Rinse BID  . divalproex  250 mg Oral Q1200  . divalproex  375 mg Oral Q12H  . docusate  100 mg Per Tube BID  . insulin aspart  0-20 Units Subcutaneous 6 times per day  . insulin glargine  20 Units Subcutaneous Daily  . ipratropium  0.5 mg Nebulization  Q6H  . levalbuterol  0.63 mg Nebulization Q6H  . lidocaine  15 mL Mouth/Throat Once  . metoprolol tartrate  25 mg Per Tube Q8H  . pantoprazole sodium  40 mg Per Tube Q1200  . polyethylene glycol  17 g Oral Daily  . sodium phosphate  1 enema Rectal Once    Continuous Infusions: . NUTREN 1.0/FIBER 52 mL/hr at 06/16/13 1749    Brynda Greathouse, MS RD LDN Clinical Inpatient Dietitian Pager: [phone] Weekend/After hours pager: [phone]

## 2013-06-23 NOTE — Progress Notes (Signed)
Inpatient Diabetes Program Recommendations  AACE/ADA: New Consensus Statement on Inpatient Glycemic Control (2013)  Target Ranges:  Prepandial:   less than 140 mg/dL      Peak postprandial:   less than 180 mg/dL (1-2 hours)      Critically ill patients:  140 - 180 mg/dL   Results for Thomas Conley, Thomas Conley (MRN [id number]) as of 06/23/2013 11:15  Ref. Range 06/22/2013 08:00 06/22/2013 11:58 06/22/2013 15:05 06/22/2013 18:52 06/22/2013 19:29 06/22/2013 23:36 06/23/2013 03:47 06/23/2013 07:31  Glucose-Capillary Latest Range: 70-99 mg/dL 409144 (H) 811222 (H) 84 53 (L) 96 90 98 174 (H)   Diabetes history: DM1  Outpatient Diabetes medications: Lantus 9-10 units daily, Novolog 2-10 units 5 times a day (sliding scale)  Current orders for Inpatient glycemic control:Lantus 20 units daily, Novolog 0-20 units Q4H   Inpatient Diabetes Program Recommendations Correction (SSI): May want to consider decreasing Novolog correction to moderate scale Q4H.  Thanks, Orlando PennerMarie Javone Ybanez, RN, MSN, CCRN Diabetes Coordinator Inpatient Diabetes Program [phone] (Team Pager) [phone] (AP office) [phone] [facility] office)

## 2013-06-23 NOTE — Progress Notes (Signed)
PULMONARY / CRITICAL CARE MEDICINE   Name: Thomas Conley MRN: [id number] DOB: [birth year]    ADMISSION DATE:  06-19-2013  REFERRING MD :  EDP PRIMARY SERVICE: PCCM  BRIEF PATIENT DESCRIPTION:  [age] M with cerebral palsy and severe kyphoscoliosis admitted via ED with severe respiratory distress likely due to PNA on Jul 15, 2013, intubated on 3/22 and extubated on 3/30, treated with abx and is off abx since 3/28.   SUBJECTIVE:  4/1 Mild to mod increase in WOB. SpO2 94% on NRB. Agitation well controlled with low dose fentanyl. Had good bowel movement on 06/22/12.    SIGNIFICANT EVENTS / STUDIES:  3/20- slow improvement. Transferred to SDU 3/21 worsenend resp status. Transferred back to ICU. NPPV resumed 3/21 Echo: LVEF 65-70% 3/22 much worse, failed NPPV, intubated 3/22 FOB: moderate secretions suctioned 3/23 Episodic severe desaturations requiring bag ventilation and suctioning. 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 Heavy sedation continued 3/26 Reduce sedation to moderate level 3/26 Extended discussion re: possibility of trach tube and anticipated implications/consequences  3/28 - 3/29 tolerating decreasing Pi in PCV mode. Tapering sedation 3/30 Passed SBT. Extubated. Problems with airway secretions post extubation. Improved with NTS. PRN BiPAP ordered 3/31 Has not required BiPAP since extubation.  Mild to mod increase in WOB. SpO2 100% on NRB. Agitation well controlled with low dose fentanyl.  4/1  Still has crakles on lung ausculation. Needs Non-rebreather   LINES / TUBES: ETT 3/22 >> 3/30 R IJ CVL 3/22 >>   CULTURES: Blood 3/17 >> NEG RVP 3/19 >> parainfluenza virus Strep Ag 3/22 >> NEG resp cult 3/22 >> NOF  ANTIBIOTICS:  Vanc 3/17 >>>3/19, 3/21 >> 3/25 Aztreonam 3/18>> 3/25 Levofloxacin 3/17 >> 3/21, 3/25 >> 3/28   VITAL SIGNS: Temp:  [97.3 F (36.3 C)-99 F (37.2  C)] 97.8 F (36.6 C) (04/01 0407) Pulse Rate:  [102-139] 133 (04/01 0600) Resp:  [19-33] 30 (04/01 0600) BP: (109-140)/(39-113) 140/81 mmHg (04/01 0600) SpO2:  [89 %-100 %] 90 % (04/01 0600) FiO2 (%):  [55 %-100 %] 100 % (04/01 0400)   VENTILATOR SETTINGS: Vent Mode:  [-]  FiO2 (%):  [55 %-100 %] 100 %  INTAKE / OUTPUT: Intake/Output     03/31 0701 - 04/01 0700 04/01 0701 - 04/02 0700   I.V. (mL/kg) 230 (4)    Other 1196    NG/GT 410    IV Piggyback     Total Intake(mL/kg) 1836 (32.2)    Urine (mL/kg/hr)     Total Output       Net +1836          Urine Occurrence 6 x    Stool Occurrence 6 x      PHYSICAL EXAMINATION: General: Severely kyphoscoliotic, mild to mod increase in WOB Neuro: awake, responding to parents, MAEs HEENT: NSC Cardiovascular: tachy, regular, no M, tr gen edema .  Lungs: diffused rales bilaterally Abdomen: G tube, soft, diminished BS Ext: contractures, muscle atrophy, 1+ pitting lower extremity edema  LABS: I have reviewed all of today's lab results. Relevant abnormalities are discussed in the A/P section  ASSESSMENT / PLAN:  PULMONARY A: Acute resp failure ARDS, resolved Resp compromise due to severe thoracic deformity and marginal cough mechanics P:   Cont to monitor closely in ICU Cont supplemental O2 titrating to maintain SpO2 > 90 % Cont NTS PRN Cont BiPAP PRN Reintubation if fails - note difficult airway Trach tube  if fails extubation Flonase q12h for 72 h Nasal decongestant spray q12h for 72 h  CARDIOVASCULAR A: Sinus tachycardia with HR of 117 Hypertension - resolved. Bp 103/69 in AM. P:  Monitor BP and rhythm Low dose PRN metoprolol to maintain HR < 130/min Cont scheduled metoprolol: 25 mg q8h per tube  RENAL A: Hypokalemia, resolved positive 19 Liters since admission P:   Monitor BMET intermittently Monitor I/Os Correct electrolytes as indicated Lasix 20 mg IV X 1 BMP in AM  GASTROINTESTINAL A: Chronic  dysphagia H/O Nissen fundoplication Chronic G tube P:   SUP: enteral PPI Cont TFs  HEMATOLOGIC A:  Thrombocytopenia - resolved. Hgb 9.1 P:  DVT px: switch SCDs to SQ lovenox Monitor CBC intermittently Transfuse per usual ICU guidelines Minimizing phlebotomy  INFECTIOUS A:  Severe sepsis, resolved Presumed PNA, resolved Parainfluenza virus bronchopneumonia, resolved P:   Abx d/c'ed since 3/28 CBC in AM  ENDOCRINE A:  IDDM, CBG 90 to 174  P:   Lantus 10 U daily/SSI   NEUROLOGIC A: Cerebral palsy Chronic muscle contractures Severe baseline cognitive impairment Baseline hearing impairment Agitation Seizure d/o Ventilator dyssynchrony P:    Cont AEDs: Cont low dose fentanyl PRN   Lorretta Harp, MD PGY3, Internal Medicine Teaching Service Pager: [phone]   I have personally obtained a history, examined the patient, evaluated laboratory and imaging results, formulated the assessment and plan and placed orders.  45 mins CCM time  Billy Fischer, MD ; [facility] [phone].  After 5:30 PM or weekends, call [phone]

## 2013-06-23 DEATH — deceased

## 2013-06-24 ENCOUNTER — Ambulatory Visit (HOSPITAL_COMMUNITY): Payer: Managed Care, Other (non HMO)

## 2013-06-24 ENCOUNTER — Encounter (HOSPITAL_COMMUNITY): Payer: Managed Care, Other (non HMO) | Admitting: Certified Registered Nurse Anesthetist

## 2013-06-24 ENCOUNTER — Inpatient Hospital Stay (HOSPITAL_COMMUNITY): Payer: Managed Care, Other (non HMO) | Admitting: Certified Registered Nurse Anesthetist

## 2013-06-24 ENCOUNTER — Inpatient Hospital Stay (HOSPITAL_COMMUNITY): Payer: Managed Care, Other (non HMO)

## 2013-06-24 LAB — BASIC METABOLIC PANEL
BUN: 16 mg/dL (ref 6–23)
Calcium: 9.4 mg/dL (ref 8.4–10.5)
Chloride: 88 mEq/L — ABNORMAL LOW (ref 96–112)
Creatinine, Ser: 0.33 mg/dL — ABNORMAL LOW (ref 0.50–1.35)
GFR calc Af Amer: >90 mL/min (ref >90)
GFR calc non Af Amer: >90 mL/min (ref >90)
GLUCOSE: 128 mg/dL — AB (ref 70–99)
POTASSIUM: 5.1 meq/L (ref 3.7–5.3)
SODIUM: 149 meq/L — AB (ref 137–147)

## 2013-06-24 LAB — CBC
HCT: 31.7 % — ABNORMAL LOW (ref 39.0–52.0)
HEMOGLOBIN: 9.4 g/dL — AB (ref 13.0–17.0)
MCH: 31.4 pg (ref 26.0–34.0)
MCHC: 29.7 g/dL — ABNORMAL LOW (ref 30.0–36.0)
MCV: 106 fL — AB (ref 78.0–100.0)
Platelets: 347 10*3/uL (ref 150–400)
RBC: 2.99 MIL/uL — ABNORMAL LOW (ref 4.22–5.81)
RDW: 14.7 % (ref 11.5–15.5)
WBC: 9.8 10*3/uL (ref 4.0–10.5)

## 2013-06-24 LAB — URINALYSIS, ROUTINE W REFLEX MICROSCOPIC
Bilirubin Urine: NEGATIVE
Glucose, UA: NEGATIVE mg/dL
Hgb urine dipstick: NEGATIVE
Ketones, ur: NEGATIVE mg/dL
LEUKOCYTES UA: NEGATIVE
Nitrite: NEGATIVE
PROTEIN: NEGATIVE mg/dL
Specific Gravity, Urine: 1.02 (ref 1.005–1.030)
UROBILINOGEN UA: 1 mg/dL (ref 0.0–1.0)
pH: 6.5 (ref 5.0–8.0)

## 2013-06-24 LAB — GLUCOSE, CAPILLARY
GLUCOSE-CAPILLARY: 242 mg/dL — AB (ref 70–99)
Glucose-Capillary: 123 mg/dL — ABNORMAL HIGH (ref 70–99)
Glucose-Capillary: 157 mg/dL — ABNORMAL HIGH (ref 70–99)

## 2013-06-24 MED ORDER — SODIUM CHLORIDE 0.9 % IV SOLN
INTRAVENOUS | Status: DC | PRN
  Administered 2013-06-24: 15:00:00 via INTRAVENOUS

## 2013-06-24 MED ORDER — FENTANYL CITRATE 0.05 MG/ML IJ SOLN
INTRAMUSCULAR | Status: AC
  Filled 2013-06-24: qty 2

## 2013-06-24 MED ORDER — FENTANYL CITRATE 0.05 MG/ML IJ SOLN: 25.0000 ug | INTRAMUSCULAR | Status: DC | PRN

## 2013-06-24 MED ORDER — FUROSEMIDE 10 MG/ML IJ SOLN
20.0000 mg | Freq: Four times a day (QID) | INTRAMUSCULAR | Status: DC
  Administered 2013-06-24: 20 mg via INTRAVENOUS
  Filled 2013-06-24: qty 2

## 2013-06-24 MED ORDER — FREE WATER
250.0000 mL | Freq: Four times a day (QID) | Status: DC
  Administered 2013-06-24: 250 mL

## 2013-06-24 MED ORDER — SUCCINYLCHOLINE CHLORIDE 20 MG/ML IJ SOLN
INTRAMUSCULAR | Status: DC | PRN
  Administered 2013-06-24 (×4): 100 mg via INTRAVENOUS

## 2013-06-24 MED ORDER — ATROPINE SULFATE 1 MG/ML IJ SOLN
INTRAMUSCULAR | Status: DC | PRN
  Administered 2013-06-24: .5 mg via INTRAVENOUS

## 2013-06-24 MED ORDER — PROPOFOL 10 MG/ML IV BOLUS
INTRAVENOUS | Status: DC | PRN
  Administered 2013-06-24 (×4): 50 mg via INTRAVENOUS

## 2013-06-24 MED ORDER — MIDAZOLAM HCL 2 MG/2ML IJ SOLN
INTRAMUSCULAR | Status: AC
  Filled 2013-06-24: qty 2

## 2013-06-24 MED ORDER — MIDAZOLAM HCL 2 MG/2ML IJ SOLN: 0.5000 mg | INTRAMUSCULAR | Status: DC | PRN

## 2013-06-24 MED FILL — Medication: Qty: 1 | Status: AC

## 2013-06-30 SURGERY — CREATION, TRACHEOSTOMY
Anesthesia: General

## 2013-07-23 NOTE — Progress Notes (Signed)
  Patient Name: Thomas FermoDaniel F Mccauslin   MRN: [id number]   Date of Birth/ Sex: [birth year] , male      Admission Date: 04/01/13  Attending Provider: Merwyn Katosavid B Charmion Hapke, MD  Primary Diagnosis: pneumonia RESPITORY FAILURE   Indication: The patient had worsening airway secretion and respiratory distress, and failed BiPAP treatment. Reintubation was attempted, but he was extremely difficult to be intubated. During the intubation process, patient developed heart arrest. Code blue was subsequently called. Tracheostomy was done. ACLS protocol was performed.    Technical Description:  - CPR performance duration:  22 min  - Was defibrillation or cardioversion used? No   - Was external pacer placed? No  - Was patient intubated pre/post CPR? Yes   Medications Administered: Y = Yes; Blank = No Amiodarone    Atropine  y  Calcium    Epinephrine  y  Lidocaine    Magnesium    Norepinephrine    Phenylephrine    Sodium bicarbonate    Vasopressin    Other    Post CPR evaluation:  - Final Status - Was patient successfully resuscitated ? No   Miscellaneous Information:  - Time of death:  15:27 PM  - Primary team notified?  Yes  - Family Notified? Yes     Lorretta HarpXilin Niu, MD PGY3, Internal Medicine Teaching Service Pager: [phone]  Billy Fischeravid Deshaun Weisinger, MD ; [facility] service  Mobile [phone].  After 5:30 PM or weekends, call [phone]

## 2013-07-23 NOTE — Progress Notes (Signed)
Anesthesiology Note:  Asked to intubate 28 year old male with  cerebral palsy, mitochondrial myopathy and severe kyphoscoliosis who was admitted on 06/11/2013 with a 3-4 day history of fever, cough and progressive dyspnea with respiratory distress. He subsequently developed respiratory failure requiring intubation on 06/13/2013. There was difficulty noted with the intubation but he was intubated using a pediatric glide scope.He was extubated on 06/21/2013 and had been on BiPAP for the last 3 days with progressive worsening of his respiratory status. We were asked to re-intubate the patient.  Upon examination, the patient  was acutely  ill  with obvious kyphoscoliosis and contractures who was on BiPAP. He was lethargic and did not appear to follow commands.  O2 Sat 90% on 100% BiPAP blood pressure 122/71 heart rate 128 respiratory rate 35  He was given 70 mg of propofol and DL x 1 with a glide scope was attempted. Severe upper airway edema was noted with what appeared to be lateral deviation of his larynx. The mucosa was extremely friable and bled  easily. The patient desaturated promptly to an oxygen saturation of 70% and mask ventilation was attempted. Mask ventilation was extremely difficult using an Ambu bag and oral airway. The decision was made to give succinylcholine 100 mg. This resulted in some improvement in ventilation with oxygen saturation improving to 88-90%. A second attempt at intubation using a glide scope was unsuccessful due to severe distortion of the anatomy and bleeding. He again desaturated promptly and a laryngeal mask airway #4 was inserted. This resulted in some improvement in ventilation. However the upper airway became more bloody and  ventilation became increasingly difficult.  The oxygen saturation went from 50% up to the mid 80% range. Several more attempts at intubation were made using  the glide scope and the [facility] 2 blade. With each attempted intubation, the oxygen saturation  came further depressed and ventilation became more difficult. He then vomited a large amount of coffee ground emesis which made subsequent laryngoscopy more difficult.  Several more doses of propofol and succinyl choline were given between attempts at intubation.   During the attempts at intubation, Dr. Christia Readingwight Bates was consulted for tracheostomy. However after he had been called and  prior to his arrival, the patient developed bradycardia progressing to asystole.Chest compressions were begun with mask ventilation. I attempted an emergency cricothyroidotomy without success. Dr. Jenne PaneBates then arrived and attempted cricothyroidotomy with both a cricothyroidotomy tube and a 6.0 endotracheal tube. Successful ventilation could not be established and further attempts at resusitation   were halted.  I informed the parents and the patient's sister about the events described and that we were unsuccessful in re-establishing an airway and that the patient expired. All questions were answered.

## 2013-07-23 NOTE — Progress Notes (Signed)
Chaplain responded to code blue.  Pt died shortly thereafter.  Chaplain contacted local parish [employer] priest for the family in order to provide the spiritual support that they desired.  Chaplain also stayed with the family in the meantime and offered pastoral support and emotional and grief comfort.  Family seemed to be appropriately grieving.  Chaplain ended visit when parish priest arrived.

## 2013-07-23 NOTE — Progress Notes (Signed)
UR Completed.  Shirlie Enck [name] [phone] 06/26/2013  

## 2013-07-23 NOTE — Discharge Summary (Signed)
PULMONARY / CRITICAL CARE MEDICINE   Name: Thomas Conley MRN: [id number] DOB: [birth year]    ADMISSION DATE:  06/10/2013  REFERRING MD :  EDP PRIMARY SERVICE: PCCM  BRIEF PATIENT DESCRIPTION:  26 M with cerebral palsy and severe kyphoscoliosis admitted via ED with severe respiratory distress likely due to PNA on 06/13/2013, intubated on 3/22 and extubated on 3/30, treated with abx and is off abx since 3/28.   SUBJECTIVE:  4/2 started BiPAP, doing OK  SIGNIFICANT EVENTS / STUDIES:  3/20- slow improvement. Transferred to SDU 3/21 worsenend resp status. Transferred back to ICU. NPPV resumed 3/21 Echo: LVEF 65-70% 3/22 much worse, failed NPPV, intubated 3/22 FOB: moderate secretions suctioned 3/23 Episodic severe desaturations requiring bag ventilation and suctioning. 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 Heavy sedation continued 3/26 Reduce sedation to moderate level 3/26 Extended discussion re: possibility of trach tube and anticipated implications/consequences  3/28 - 3/29 tolerating decreasing Pi in PCV mode. Tapering sedation 3/30 Passed SBT. Extubated. Problems with airway secretions post extubation. Improved with NTS. PRN BiPAP ordered 3/31 Has not required BiPAP since extubation.  Mild to mod increase in WOB. SpO2 100% on NRB. Agitation well controlled with low dose fentanyl.  4/1  Still has crakles on lung ausculation. Needs Non-rebreather  4/2 on BiPAP 4/2 CXR: still extensive bilateral airspace disease, which has not changed significantly  LINES / TUBES: ETT 3/22 >> 3/30 R IJ CVL 3/22 >>  ETT (reintuated) 4/2>>  CULTURES: Blood 3/17 >> NEG RVP 3/19 >> parainfluenza virus Strep Ag 3/22 >> NEG resp cult 3/22 >> NOF  ANTIBIOTICS:  Vanc 3/17 >>>3/19, 3/21 >> 3/25 Aztreonam 3/18>> 3/25 Levofloxacin 3/17 >> 3/21, 3/25 >> 3/28   VITAL SIGNS: Temp:  [97.4 F (36.3  C)-97.9 F (36.6 C)] 97.9 F (36.6 C) (04/02 0800) Pulse Rate:  [111-130] 130 (04/02 1100) Resp:  [19-37] 37 (04/02 1100) BP: (103-129)/(42-87) 124/48 mmHg (04/02 1100) SpO2:  [84 %-100 %] 91 % (04/02 1100) FiO2 (%):  [80 %-100 %] 100 % (04/02 1100) Weight:  [117 lb 11.6 oz (53.4 kg)] 117 lb 11.6 oz (53.4 kg) (04/02 0400)   VENTILATOR SETTINGS: Vent Mode:  [-] PCV;BIPAP FiO2 (%):  [80 %-100 %] 100 % Set Rate:  [8 bmp] 8 bmp PEEP:  [6 cmH20] 6 cmH20  INTAKE / OUTPUT: Intake/Output     04/01 0701 - 04/02 0700 04/02 0701 - 04/03 0700   I.V. (mL/kg) 390 (7.3)    Other 1196    NG/GT     Total Intake(mL/kg) 1586 (29.7)    Net +1586          Urine Occurrence 10 x      PHYSICAL EXAMINATION: General: Severely kyphoscoliotic, mild to mod increase in WOB Neuro: awake, responding to parents, MAEs HEENT: NSC Cardiovascular: tachy, regular, no M, tr gen edema .  Lungs: diffused rales bilaterally Abdomen: G tube, soft, diminished BS Ext: contractures, muscle atrophy, 1+ pitting lower extremity edema  LABS: I have reviewed all of today's lab results. Relevant abnormalities are discussed in the A/P section  ASSESSMENT / PLAN:  PULMONARY A: Acute resp failure Resp compromise due to severe thoracic deformity and marginal cough mechanics Failed BiPAP P:   Cont to monitor closely in ICU Cont supplemental O2 titrating to maintain SpO2 > 90 % Cont NTS PRN Reintubation today Will get Trach  Flonase q12h for 24 h Nasal decongestant  spray q12h for 24 h  CARDIOVASCULAR A: Sinus tachycardia with HR of 117 Hypertension - resolved.  P:  Monitor BP and rhythm Low dose PRN metoprolol to maintain HR < 130/min Cont scheduled metoprolol: 25 mg q8h per tube  RENAL A: Hypokalemia, resolved positive 21 Liters since admission P:   Monitor BMET intermittently Monitor I/Os Correct electrolytes as indicated Lasix 20 mg IV X 2 BMP in AM  GASTROINTESTINAL A: Chronic dysphagia H/O  Nissen fundoplication Chronic G tube P:   SUP: enteral PPI Cont TFs  HEMATOLOGIC A:  Thrombocytopenia - resolved. P:  DVT px: SQ lovenox Monitor CBC intermittently Transfuse per usual ICU guidelines Minimizing phlebotomy  INFECTIOUS A:  Severe sepsis, resolved Presumed PNA, resolved Parainfluenza virus bronchopneumonia, resolved P:   Abx d/c'ed since 3/28  ENDOCRINE A:  IDDM, CBG 120 to 155 P:   Lantus 10 U daily and SSI   NEUROLOGIC A: Cerebral palsy Chronic muscle contractures Severe baseline cognitive impairment Baseline hearing impairment Agitation Seizure d/o Ventilator dyssynchrony P:    Cont AEDs: Cont low dose fentanyl PRN   Lorretta HarpXilin Niu, MD PGY3, Internal Medicine Teaching Service Pager: [phone]   I spoke at length with family regarding options in face of progressive respiratory failure and increasing WOB despite NPPV. We agreed that the proper course of action is to intubate with plan for elective trach tube placement by ENT. They were apprised of all of the implications of tracheostomy tube placement and they understand that he will not likely be able to return to some of his previous activities. Anesthesia has been called for intubation in pt with documented difficult airway  85 mins CCM time  Billy Fischeravid Simonds, MD ; [facility] Mobile [phone].  After 5:30 PM or weekends, call [phone]   ADD: see notes regarding subsequent inability to establish an airway and resultant cardiac arrest. I was present for the entire attempt and participated in the administration of ACLS. In the course of providing CPR, I spoke with family and described the tragic events. I explained that he had undergone at least 15 minutes of ACLS without restoration of pulse and with persistence of asystole. I indicated that further ACLS would not lead to a favorable outcome. After my return to the room, I discussed with Dr Noreene LarssonJoslin and we agreed to stop resuscitative efforts. Pt was  pronounced dead @ that time.  I spent an additional 15-20 mins with the family expressing our sympathies and sharing in their grief. They were remarkably gracious and understanding and expressed their thanks for our efforts and the efforts of all of the ICU staff   Billy Fischeravid Simonds, MD ; [facility] Mobile [phone].  After 5:30 PM or weekends, call [phone]

## 2013-07-23 NOTE — Consult Note (Signed)
Reason for Consult: Respiratory failure Referring Physician: CCM  Thomas Conley is an 28 y.o. male.  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  On 3/22, due to worsening respiratory distress, he was intubated and treated with mechanical ventilation.  He was able to be weaned and was extubated 3/30.  Since then, he has had more difficulty with respiratory distress.  Today, the decision was made to proceed with elective replacement of a tracheostomy tube.  Today, intubation was required but difficulty was encountered and an emergent consult was requested.  The patient lost a pulse and the anesthesiologist had begun a cricothyrotomy when I arrived.  Past Medical History  Diagnosis Date  . 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 Mitochondrial myopathy   . Mental retardation   . GERD (gastroesophageal reflux disease)   . Bronchiectasis   . Hypoglycemia associated with diabetes   . Osteopenia   . Fatigue   . Gastrostomy tube dependent   . Scoliosis   . Cerebral palsy     Past Surgical History  Procedure Laterality Date  . Gastrostomy tube placement    . Muscle biopsies    . Nissen fundoplication    . Pressure equalization tube    . Inguinal hernia repair    . Left tear duct      Family History  Problem Relation Age of Onset  . Cancer Neg Hx   . Diabetes Neg Hx   . Thyroid disease Neg Hx     Social History:  reports that he has never smoked. He does not have any smokeless tobacco history on file. He reports that he does not drink alcohol or use illicit drugs.  Allergies:  Allergies  Allergen Reactions  . Other Anaphylaxis    *all narcotics*  . Augmentin [Amoxicillin-Pot Clavulanate]   . Cefuroxime Axetil   . Cephalosporins   . Klonopin [Clonazepam]   . Morphine And Related   . Sulfa Antibiotics   . Tegretol  [Carbamazepine]     Medications: I have reviewed the patient's current medications.  Results for orders placed during the hospital encounter of 05/28/2013 (from the past 48 hour(s))  GLUCOSE, CAPILLARY     Status: Abnormal   Collection Time    06/22/13  6:52 PM      Result Value Ref Range   Glucose-Capillary 53 (*) 70 - 99 mg/dL  GLUCOSE, CAPILLARY     Status: None   Collection Time    06/22/13  7:29 PM      Result Value Ref Range   Glucose-Capillary 96  70 - 99 mg/dL  GLUCOSE, CAPILLARY     Status: None   Collection Time    06/22/13 11:36 PM      Result Value Ref Range   Glucose-Capillary 90  70 - 99 mg/dL  GLUCOSE, CAPILLARY     Status: None   Collection Time    06/23/13  3:47 AM      Result Value Ref Range   Glucose-Capillary 98  70 - 99 mg/dL  BASIC METABOLIC PANEL     Status: Abnormal   Collection Time    06/23/13  5:00 AM      Result Value Ref Range   Sodium 144  137 - 147 mEq/L   Potassium 4.4  3.7 - 5.3 mEq/L   Chloride 92 (*) 96 - 112 mEq/L  CO2 >45 (*) 19 - 32 mEq/L   Comment: CRITICAL RESULT CALLED TO, READ BACK BY AND VERIFIED WITH:     SPERRY,A RN 06/23/2013 0648 JORDANS     CRITICAL VALUE NOTED.  VALUE IS CONSISTENT WITH PREVIOUSLY REPORTED AND CALLED VALUE.   Glucose, Bld 140 (*) 70 - 99 mg/dL   BUN 16  6 - 23 mg/dL   Creatinine, Ser 0.36 (*) 0.50 - 1.35 mg/dL   Calcium 9.1  8.4 - 10.5 mg/dL   GFR calc non Af Amer >90  >90 mL/min   GFR calc Af Amer >90  >90 mL/min   Comment: (NOTE)     The eGFR has been calculated using the CKD EPI equation.     This calculation has not been validated in all clinical situations.     eGFR's persistently <90 mL/min signify possible Chronic Kidney     Disease.  CBC     Status: Abnormal   Collection Time    06/23/13  5:00 AM      Result Value Ref Range   WBC 8.6  4.0 - 10.5 K/uL   RBC 2.94 (*) 4.22 - 5.81 MIL/uL   Hemoglobin 9.1 (*) 13.0 - 17.0 g/dL   HCT 30.1 (*) 39.0 - 52.0 %   MCV 102.4 (*) 78.0 - 100.0 fL   MCH  31.0  26.0 - 34.0 pg   MCHC 30.2  30.0 - 36.0 g/dL   RDW 14.9  11.5 - 15.5 %   Platelets 293  150 - 400 K/uL  GLUCOSE, CAPILLARY     Status: Abnormal   Collection Time    06/23/13  7:31 AM      Result Value Ref Range   Glucose-Capillary 174 (*) 70 - 99 mg/dL  GLUCOSE, CAPILLARY     Status: Abnormal   Collection Time    06/23/13 11:49 AM      Result Value Ref Range   Glucose-Capillary 176 (*) 70 - 99 mg/dL  GLUCOSE, CAPILLARY     Status: Abnormal   Collection Time    06/23/13  3:55 PM      Result Value Ref Range   Glucose-Capillary 165 (*) 70 - 99 mg/dL  GLUCOSE, CAPILLARY     Status: Abnormal   Collection Time    06/23/13  7:03 PM      Result Value Ref Range   Glucose-Capillary 138 (*) 70 - 99 mg/dL  GLUCOSE, CAPILLARY     Status: Abnormal   Collection Time    06/23/13 11:44 PM      Result Value Ref Range   Glucose-Capillary 155 (*) 70 - 99 mg/dL   Comment 1 Documented in Chart     Comment 2 Notify RN    GLUCOSE, CAPILLARY     Status: Abnormal   Collection Time    2013/07/09  4:07 AM      Result Value Ref Range   Glucose-Capillary 123 (*) 70 - 99 mg/dL   Comment 1 Documented in Chart     Comment 2 Notify RN    BASIC METABOLIC PANEL     Status: Abnormal   Collection Time    09-Jul-2013  4:30 AM      Result Value Ref Range   Sodium 149 (*) 137 - 147 mEq/L   Potassium 5.1  3.7 - 5.3 mEq/L   Chloride 88 (*) 96 - 112 mEq/L   CO2 >45 (*) 19 - 32 mEq/L   Comment: CRITICAL RESULT CALLED TO, READ BACK BY  AND VERIFIED WITH:     ROYAL C,RN Jun 28, 2013 0517 WAYK   Glucose, Bld 128 (*) 70 - 99 mg/dL   BUN 16  6 - 23 mg/dL   Creatinine, Ser 0.33 (*) 0.50 - 1.35 mg/dL   Calcium 9.4  8.4 - 10.5 mg/dL   GFR calc non Af Amer >90  >90 mL/min   GFR calc Af Amer >90  >90 mL/min   Comment: (NOTE)     The eGFR has been calculated using the CKD EPI equation.     This calculation has not been validated in all clinical situations.     eGFR's persistently <90 mL/min signify possible Chronic  Kidney     Disease.  CBC     Status: Abnormal   Collection Time    06-28-2013  4:30 AM      Result Value Ref Range   WBC 9.8  4.0 - 10.5 K/uL   RBC 2.99 (*) 4.22 - 5.81 MIL/uL   Hemoglobin 9.4 (*) 13.0 - 17.0 g/dL   HCT 31.7 (*) 39.0 - 52.0 %   MCV 106.0 (*) 78.0 - 100.0 fL   MCH 31.4  26.0 - 34.0 pg   MCHC 29.7 (*) 30.0 - 36.0 g/dL   RDW 14.7  11.5 - 15.5 %   Platelets 347  150 - 400 K/uL  GLUCOSE, CAPILLARY     Status: Abnormal   Collection Time    28-Jun-2013  7:41 AM      Result Value Ref Range   Glucose-Capillary 157 (*) 70 - 99 mg/dL  URINALYSIS, ROUTINE W REFLEX MICROSCOPIC     Status: None   Collection Time    2013-06-28 10:09 AM      Result Value Ref Range   Color, Urine YELLOW  YELLOW   APPearance CLEAR  CLEAR   Comment: LESS THAN 10 mL OF URINE SUBMITTED   Specific Gravity, Urine 1.020  1.005 - 1.030   pH 6.5  5.0 - 8.0   Glucose, UA NEGATIVE  NEGATIVE mg/dL   Hgb urine dipstick NEGATIVE  NEGATIVE   Bilirubin Urine NEGATIVE  NEGATIVE   Ketones, ur NEGATIVE  NEGATIVE mg/dL   Protein, ur NEGATIVE  NEGATIVE mg/dL   Urobilinogen, UA 1.0  0.0 - 1.0 mg/dL   Nitrite NEGATIVE  NEGATIVE   Leukocytes, UA NEGATIVE  NEGATIVE   Comment: MICROSCOPIC NOT DONE ON URINES WITH NEGATIVE PROTEIN, BLOOD, LEUKOCYTES, NITRITE, OR GLUCOSE <1000 mg/dL.  GLUCOSE, CAPILLARY     Status: Abnormal   Collection Time    28-Jun-2013 12:34 PM      Result Value Ref Range   Glucose-Capillary 242 (*) 70 - 99 mg/dL    Dg Chest Port 1 View  28-Jun-2013   CLINICAL DATA:  Respiratory failure.  EXAM: PORTABLE CHEST - 1 VIEW  COMPARISON:  Single view of the chest 06/22/2013 and 06/21/2013.  FINDINGS: Extensive bilateral airspace disease persists. Heart size is upper normal. No pneumothorax is identified appear Severe scoliosis is again noted.  IMPRESSION: Persistent extensive bilateral airspace disease could be due to atelectasis or pneumonia. The appearance is worrisome for pneumonia.   Electronically Signed    By: Inge Rise M.D.   On: 2013/06/28 07:27    Review of Systems  Unable to perform ROS: critical illness   Blood pressure 120/78, pulse 111, temperature 97.9 F (36.6 C), temperature source Axillary, resp. rate 16, height 5' (1.524 m), weight 53.4 kg (117 lb 11.6 oz), SpO2 63.00%. Physical Exam  Constitutional:  Examination severely limited by situation.  Unresponsive patient in the midst of CPR with no pulse and no cardiac electrical activity.  When I arrived, an incision had been made in the low anterior neck with bleeding.    Assessment/Plan: Respiratory failure I responded as quickly as possible to the emergency consult request.  Upon my arrival, I took over efforts to get a surgical airway.  See the procedure note.  Unfortunately, in spite of obtaining a surgical airway, the patient never recovered and was pronounced dead after several minutes of CPR efforts.  Phyillis Dascoli July 05, 2013, 6:01 PM

## 2013-07-23 NOTE — Progress Notes (Signed)
CPT not done due to family not wanting at this time, pt appears agitated, sats 87% on bipap. Will attempt at later time.

## 2013-07-23 NOTE — Procedures (Signed)
Preop diagnosis: Respiratory failure Postop diagnosis: same Procedure: Emergency tracheostomy Surgeon: Jenne PaneBates Anesth: None Description: Upon my arrival, the anesthesiologist had made a skin incision and was down to the trachea.  I stepped in and palpated in the wound and found the trachea.  A horizontal incision was made in the anterior tracheal wall and the lumen was palpated.  The tracheostomy tube that was already open was then placed into the lumen and the cuff inflated.  Placement was confirmed by bronchoscopy through the tube.  As CPR proceeded, the anesthesia team felt a lot of resistance with bag ventilation.  The trach tube that was placed was pediatric sized.  So, the tube was removed and attempts were then made to place a 6.0 endotracheal tube through the wound.  This was difficult with a couple of failed passes.  Ultimately, the tube was able to be placed in the tracheal lumen and ventilation was possible.  CPR continued for several more minutes but was then stopped and the patient pronounced dead.

## 2013-07-23 NOTE — Progress Notes (Signed)
CRITICAL VALUE ALERT  Critical value received:  CO2>45  Date of notification:  06/29/2013  Time of notification:  0520  Critical value read back:yes  Nurse who received alert:  Berkley Harveyhristine Tom Ragsdale, RN  MD notified (1st page):  Dr. Dierdre SearlesLi  Time of first page:  Notified in person - 0521  MD notified (2nd page):  Time of second page:  Responding MD:  Dr. Dierdre SearlesLi  Time MD responded:  Notified in person - [phone]

## 2013-07-23 DEATH — deceased

## 2014-03-31 IMAGING — CR DG CHEST 2V
2 series · 2 of 2 positions shown · non-contrast
Comparison: 01/06/2008.

CLINICAL DATA: Cough and congestion.

CHEST - 2 VIEW

[w chest lat]
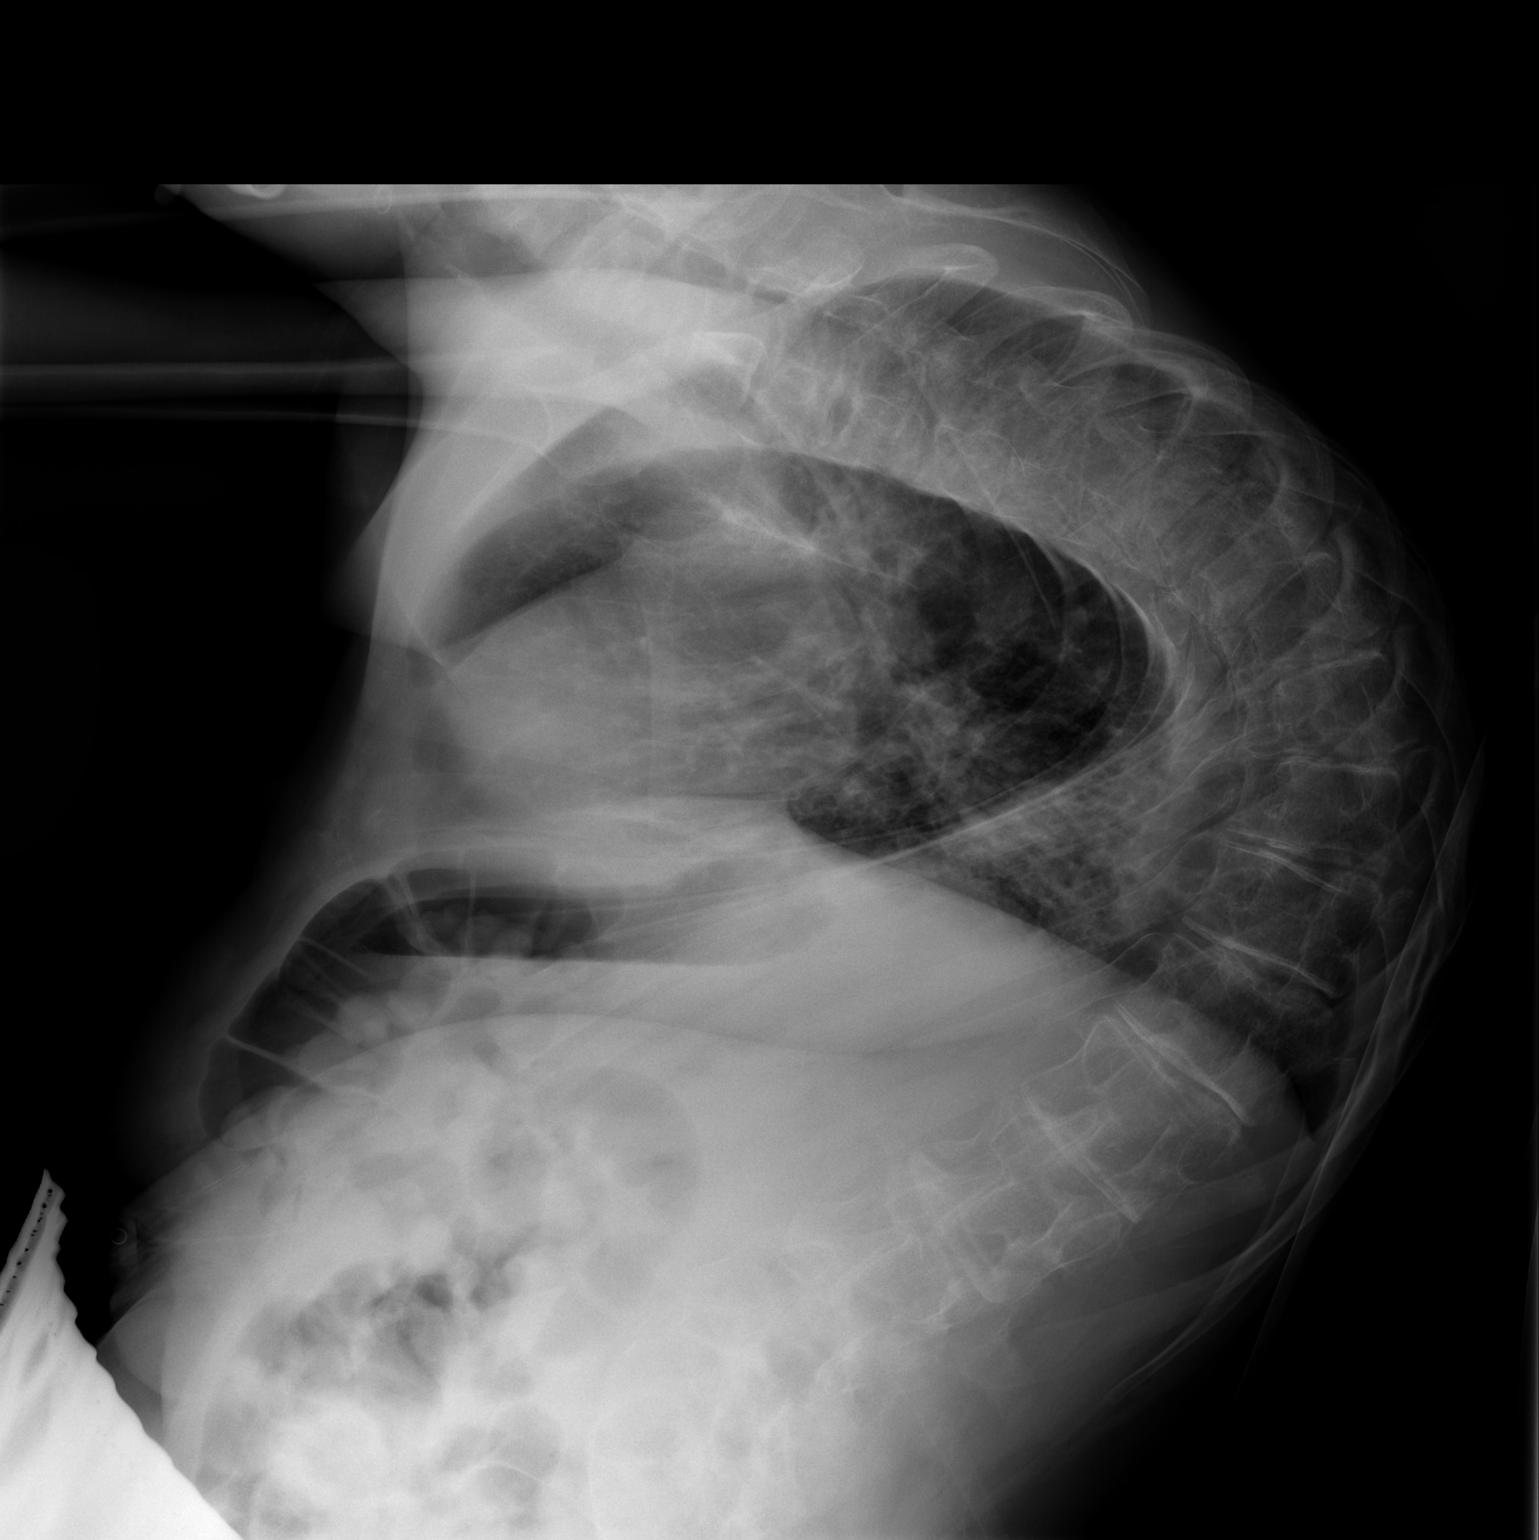

[w chest ap]
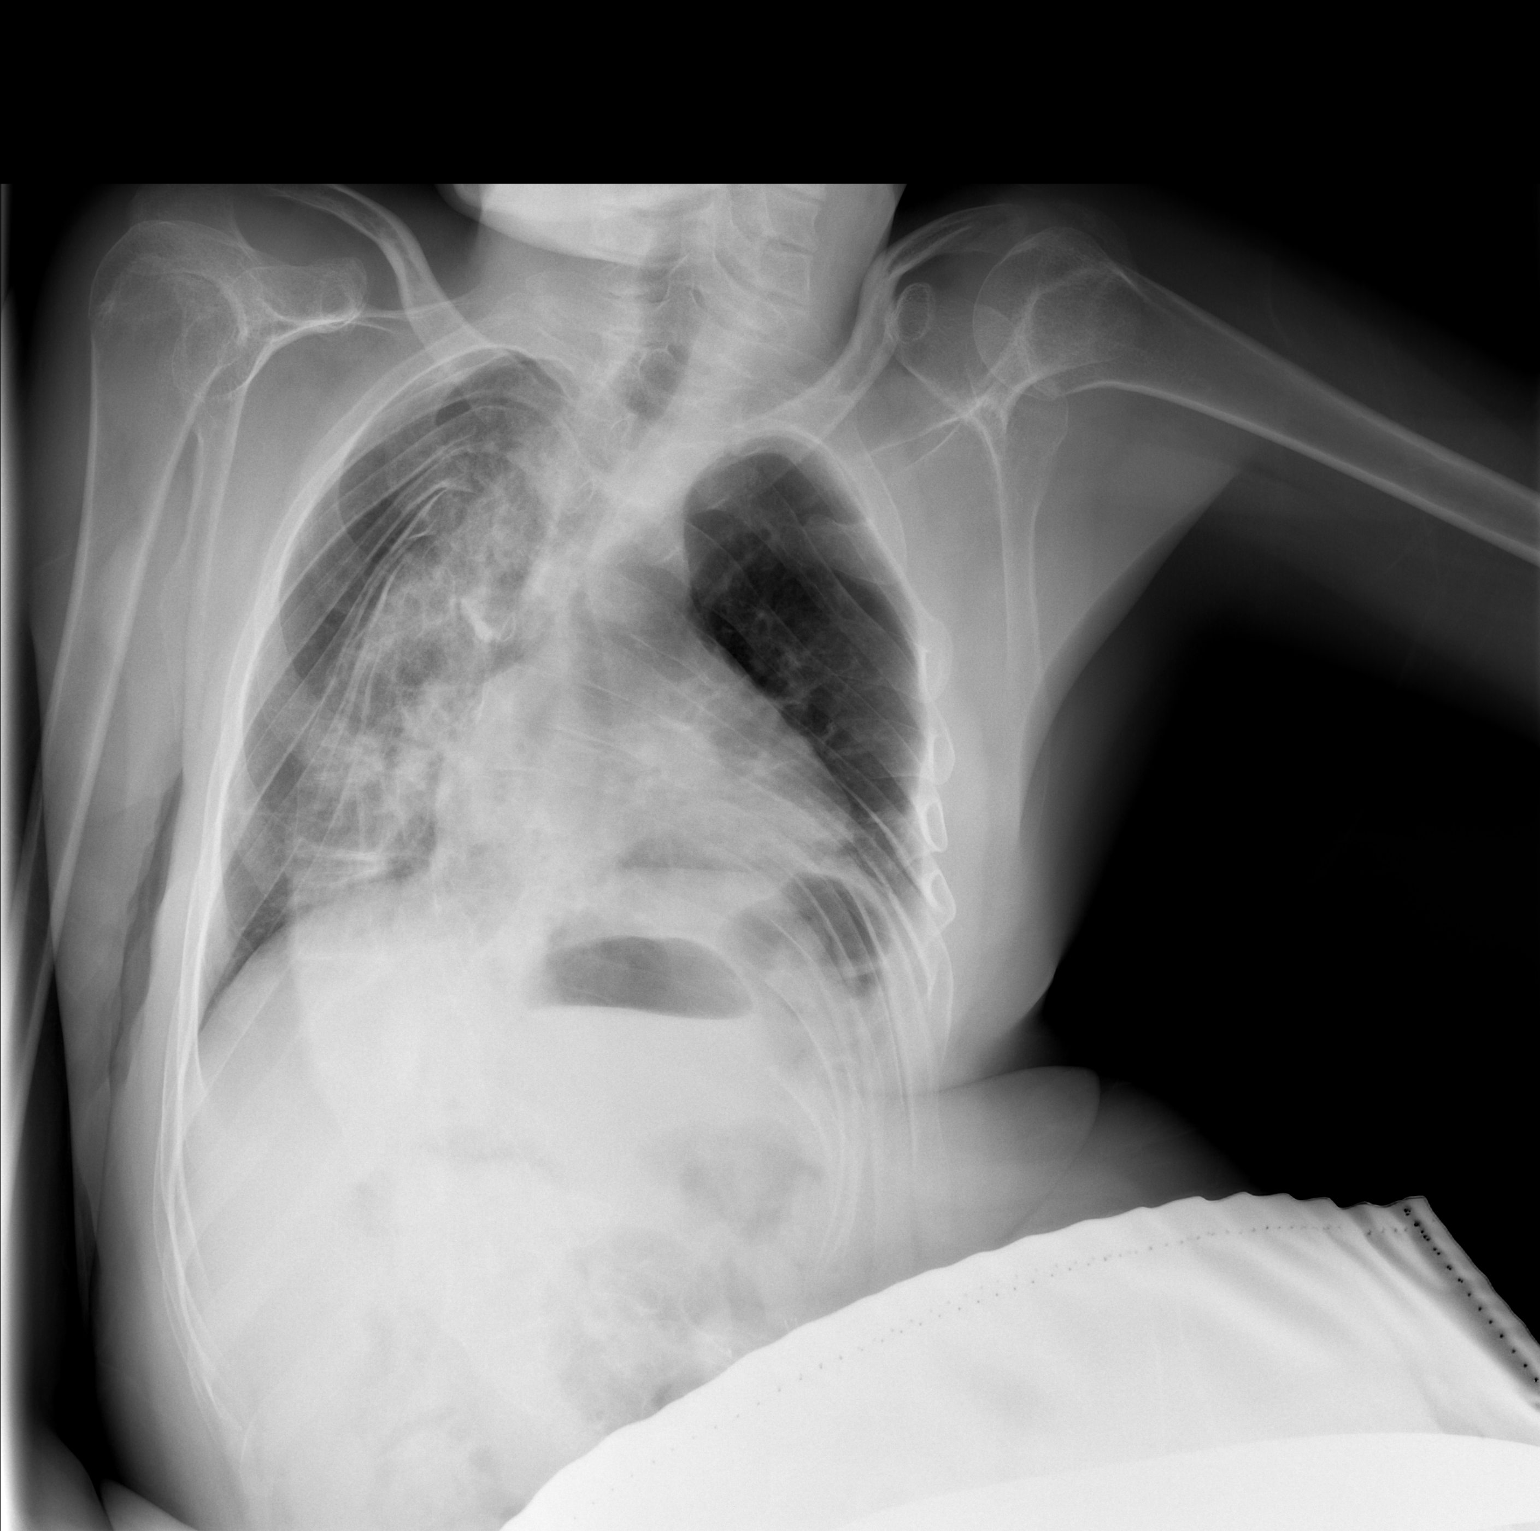

[2 of 2 positions shown; findings below may reference images not displayed]

FINDINGS: Severe dextroconvex kyphoscoliosis with distortion of the
thoracic anatomy.  There may be left basilar airspace
consolidation.  No definite pleural fluid.
IMPRESSION: Possible left basilar airspace disease.
